# Patient Record
Sex: Female | Born: 1980 | Race: White | Hispanic: No | Marital: Single | Smoking: Current every day smoker
Health system: Southern US, Community
[De-identification: ages and names within clinical notes are randomized; demographics above are authoritative.]

## PROBLEM LIST (undated history)

## (undated) DIAGNOSIS — F419 Anxiety disorder, unspecified: Secondary | ICD-10-CM

## (undated) DIAGNOSIS — F319 Bipolar disorder, unspecified: Secondary | ICD-10-CM

## (undated) DIAGNOSIS — T7840XA Allergy, unspecified, initial encounter: Secondary | ICD-10-CM

## (undated) DIAGNOSIS — R569 Unspecified convulsions: Secondary | ICD-10-CM

## (undated) DIAGNOSIS — F840 Autistic disorder: Secondary | ICD-10-CM

## (undated) DIAGNOSIS — F909 Attention-deficit hyperactivity disorder, unspecified type: Secondary | ICD-10-CM

## (undated) DIAGNOSIS — F32A Depression, unspecified: Secondary | ICD-10-CM

## (undated) HISTORY — DX: Attention-deficit hyperactivity disorder, unspecified type: F90.9

## (undated) HISTORY — DX: Allergy, unspecified, initial encounter: T78.40XA

## (undated) HISTORY — DX: Depression, unspecified: F32.A

## (undated) HISTORY — PX: LEFT OOPHORECTOMY: SHX1961

## (undated) HISTORY — DX: Anxiety disorder, unspecified: F41.9

---

## 2003-11-10 ENCOUNTER — Emergency Department: Payer: Self-pay | Admitting: Emergency Medicine

## 2004-05-21 ENCOUNTER — Inpatient Hospital Stay: Payer: Self-pay | Admitting: Unknown Physician Specialty

## 2004-06-08 ENCOUNTER — Emergency Department: Payer: Self-pay | Admitting: Emergency Medicine

## 2004-07-17 ENCOUNTER — Emergency Department: Payer: Self-pay | Admitting: Emergency Medicine

## 2005-01-30 ENCOUNTER — Emergency Department: Payer: Self-pay | Admitting: Emergency Medicine

## 2006-06-17 ENCOUNTER — Inpatient Hospital Stay: Payer: Self-pay | Admitting: Psychiatry

## 2006-09-29 ENCOUNTER — Emergency Department: Payer: Self-pay | Admitting: Unknown Physician Specialty

## 2006-11-23 ENCOUNTER — Emergency Department: Payer: Self-pay | Admitting: Emergency Medicine

## 2006-12-30 ENCOUNTER — Emergency Department: Payer: Self-pay | Admitting: Emergency Medicine

## 2009-02-19 ENCOUNTER — Emergency Department: Payer: Self-pay | Admitting: Internal Medicine

## 2009-05-25 ENCOUNTER — Emergency Department: Payer: Self-pay | Admitting: Emergency Medicine

## 2009-08-09 ENCOUNTER — Emergency Department: Payer: Self-pay

## 2009-09-01 ENCOUNTER — Emergency Department: Payer: Self-pay | Admitting: Internal Medicine

## 2009-09-07 ENCOUNTER — Emergency Department: Payer: Self-pay | Admitting: Internal Medicine

## 2010-04-18 ENCOUNTER — Emergency Department: Payer: Self-pay | Admitting: Emergency Medicine

## 2010-08-01 ENCOUNTER — Emergency Department: Payer: Self-pay | Admitting: Emergency Medicine

## 2010-08-06 ENCOUNTER — Emergency Department: Payer: Self-pay | Admitting: Emergency Medicine

## 2010-08-12 ENCOUNTER — Emergency Department: Payer: Self-pay | Admitting: Emergency Medicine

## 2010-08-31 ENCOUNTER — Emergency Department: Payer: Self-pay | Admitting: *Deleted

## 2010-10-11 ENCOUNTER — Emergency Department (HOSPITAL_COMMUNITY)
Admission: EM | Admit: 2010-10-11 | Discharge: 2010-10-11 | Disposition: A | Discharge status: Home / Self Care | Payer: Medicaid Other | Attending: Emergency Medicine | Admitting: Emergency Medicine

## 2010-10-11 ENCOUNTER — Emergency Department (HOSPITAL_COMMUNITY): Payer: Medicaid Other

## 2010-10-11 DIAGNOSIS — IMO0002 Reserved for concepts with insufficient information to code with codable children: Secondary | ICD-10-CM | POA: Insufficient documentation

## 2010-10-11 DIAGNOSIS — N898 Other specified noninflammatory disorders of vagina: Secondary | ICD-10-CM | POA: Insufficient documentation

## 2010-10-11 DIAGNOSIS — N83209 Unspecified ovarian cyst, unspecified side: Secondary | ICD-10-CM | POA: Insufficient documentation

## 2010-10-11 DIAGNOSIS — R1031 Right lower quadrant pain: Secondary | ICD-10-CM | POA: Insufficient documentation

## 2010-10-11 DIAGNOSIS — G40909 Epilepsy, unspecified, not intractable, without status epilepticus: Secondary | ICD-10-CM | POA: Insufficient documentation

## 2010-10-11 DIAGNOSIS — R112 Nausea with vomiting, unspecified: Secondary | ICD-10-CM | POA: Insufficient documentation

## 2010-10-11 LAB — WET PREP, GENITAL
Clue Cells Wet Prep HPF POC: NONE SEEN
Trich, Wet Prep: NONE SEEN
Yeast Wet Prep HPF POC: NONE SEEN

## 2010-10-11 LAB — POCT PREGNANCY, URINE: Preg Test, Ur: NEGATIVE

## 2010-10-11 LAB — URINALYSIS, ROUTINE W REFLEX MICROSCOPIC
Ketones, ur: NEGATIVE mg/dL
Leukocytes, UA: NEGATIVE
Nitrite: NEGATIVE
Specific Gravity, Urine: 1.01 (ref 1.005–1.030)
pH: 5.5 (ref 5.0–8.0)

## 2010-12-06 ENCOUNTER — Emergency Department: Payer: Self-pay | Admitting: Emergency Medicine

## 2011-10-06 ENCOUNTER — Emergency Department: Payer: Self-pay | Admitting: Emergency Medicine

## 2011-10-06 LAB — BASIC METABOLIC PANEL
Calcium, Total: 9.9 mg/dL (ref 8.5–10.1)
Creatinine: 0.74 mg/dL (ref 0.60–1.30)
EGFR (Non-African Amer.): >60
Sodium: 138 mmol/L (ref 136–145)

## 2011-10-06 LAB — CBC WITH DIFFERENTIAL/PLATELET
Basophil %: 0.8 %
HCT: 38.5 % (ref 35.0–47.0)
HGB: 13.4 g/dL (ref 12.0–16.0)
Lymphocyte %: 25.3 %
Monocyte %: 8.3 %
Neutrophil #: 3.3 10*3/uL (ref 1.4–6.5)
WBC: 5.2 10*3/uL (ref 3.6–11.0)

## 2011-10-06 LAB — PHENYTOIN LEVEL, TOTAL: Dilantin: <0.4 ug/mL — ABNORMAL LOW (ref 10.0–20.0)

## 2011-11-19 ENCOUNTER — Emergency Department: Payer: Self-pay | Admitting: Emergency Medicine

## 2011-11-19 LAB — CBC WITH DIFFERENTIAL/PLATELET
Eosinophil %: 1.3 %
HGB: 13.8 g/dL (ref 12.0–16.0)
Lymphocyte #: 1.4 10*3/uL (ref 1.0–3.6)
MCH: 32.7 pg (ref 26.0–34.0)
Monocyte %: 7.3 %
Platelet: 202 10*3/uL (ref 150–440)
RBC: 4.22 10*6/uL (ref 3.80–5.20)

## 2011-11-19 LAB — PHENYTOIN LEVEL, TOTAL: Dilantin: <0.4 ug/mL — ABNORMAL LOW (ref 10.0–20.0)

## 2011-11-19 LAB — BASIC METABOLIC PANEL
Calcium, Total: 8.5 mg/dL (ref 8.5–10.1)
Chloride: 111 mmol/L — ABNORMAL HIGH (ref 98–107)
Osmolality: 282 (ref 275–301)
Potassium: 3.9 mmol/L (ref 3.5–5.1)

## 2012-03-01 ENCOUNTER — Emergency Department: Payer: Self-pay | Admitting: Emergency Medicine

## 2012-03-01 LAB — CBC WITH DIFFERENTIAL/PLATELET
Basophil #: 0 10*3/uL (ref 0.0–0.1)
Basophil %: 0.8 %
HCT: 41 % (ref 35.0–47.0)
MCH: 31.7 pg (ref 26.0–34.0)
MCHC: 34 g/dL (ref 32.0–36.0)
Monocyte #: 0.4 x10 3/mm (ref 0.2–0.9)
Monocyte %: 6.9 %
Neutrophil #: 3 10*3/uL (ref 1.4–6.5)

## 2012-03-01 LAB — URINALYSIS, COMPLETE
Bilirubin,UR: NEGATIVE
Blood: NEGATIVE
Nitrite: NEGATIVE
Specific Gravity: 1.023 (ref 1.003–1.030)

## 2012-03-01 LAB — BASIC METABOLIC PANEL
Anion Gap: 7 (ref 7–16)
BUN: 20 mg/dL — ABNORMAL HIGH (ref 7–18)
Co2: 27 mmol/L (ref 21–32)
Creatinine: 0.7 mg/dL (ref 0.60–1.30)
EGFR (Non-African Amer.): >60
Osmolality: 278 (ref 275–301)
Sodium: 138 mmol/L (ref 136–145)

## 2012-03-01 LAB — PHENYTOIN LEVEL, TOTAL: Dilantin: 0.6 ug/mL — ABNORMAL LOW (ref 10.0–20.0)

## 2012-03-09 ENCOUNTER — Inpatient Hospital Stay: Payer: Self-pay | Admitting: Internal Medicine

## 2012-03-09 LAB — CBC WITH DIFFERENTIAL/PLATELET
Basophil #: 0 10*3/uL (ref 0.0–0.1)
Basophil %: 0.5 %
Eosinophil #: 0.1 10*3/uL (ref 0.0–0.7)
Eosinophil %: 0.9 %
HCT: 42.1 % (ref 35.0–47.0)
HGB: 14.4 g/dL (ref 12.0–16.0)
Lymphocyte #: 1.6 10*3/uL (ref 1.0–3.6)
Lymphocyte %: 21.9 %
MCH: 31.9 pg (ref 26.0–34.0)
MCHC: 34.1 g/dL (ref 32.0–36.0)
MCV: 93 fL (ref 80–100)
Monocyte #: 0.3 x10 3/mm (ref 0.2–0.9)
Monocyte %: 4.8 %
Neutrophil #: 5.1 10*3/uL (ref 1.4–6.5)
Neutrophil %: 71.9 %
Platelet: 245 10*3/uL (ref 150–440)
RBC: 4.51 10*6/uL (ref 3.80–5.20)
RDW: 12.8 % (ref 11.5–14.5)
WBC: 7.1 10*3/uL (ref 3.6–11.0)

## 2012-03-09 LAB — HEPATIC FUNCTION PANEL A (ARMC)
Albumin: 4 g/dL (ref 3.4–5.0)
Alkaline Phosphatase: 98 U/L (ref 50–136)
Bilirubin, Direct: <0.05 mg/dL (ref 0.00–0.20)
Bilirubin,Total: 0.2 mg/dL (ref 0.2–1.0)
SGOT(AST): 25 U/L (ref 15–37)
SGPT (ALT): 52 U/L (ref 12–78)
Total Protein: 7.3 g/dL (ref 6.4–8.2)

## 2012-03-09 LAB — BASIC METABOLIC PANEL
Anion Gap: 8 (ref 7–16)
BUN: 14 mg/dL (ref 7–18)
Calcium, Total: 8.7 mg/dL (ref 8.5–10.1)
Chloride: 106 mmol/L (ref 98–107)
Co2: 25 mmol/L (ref 21–32)
Creatinine: 0.69 mg/dL (ref 0.60–1.30)
EGFR (African American): >60
EGFR (Non-African Amer.): >60
Glucose: 195 mg/dL — ABNORMAL HIGH (ref 65–99)
Osmolality: 283 (ref 275–301)
Potassium: 3.2 mmol/L — ABNORMAL LOW (ref 3.5–5.1)
Sodium: 139 mmol/L (ref 136–145)

## 2012-03-09 LAB — URINALYSIS, COMPLETE
Bilirubin,UR: NEGATIVE
Glucose,UR: NEGATIVE mg/dL (ref 0–75)
Leukocyte Esterase: NEGATIVE
Nitrite: NEGATIVE
Ph: 5 (ref 4.5–8.0)
Protein: NEGATIVE
RBC,UR: 20 /HPF (ref 0–5)
Specific Gravity: 1.028 (ref 1.003–1.030)
Squamous Epithelial: 7
WBC UR: 1 /HPF (ref 0–5)

## 2012-03-09 LAB — ETHANOL: Ethanol %: <0.003 % (ref 0.000–0.080)

## 2012-03-09 LAB — PHENYTOIN LEVEL, TOTAL
Dilantin: 44.7 ug/mL (ref 10.0–20.0)
Dilantin: 38.5 ug/mL (ref 10.0–20.0)

## 2012-03-10 LAB — HEMOGLOBIN A1C: Hemoglobin A1C: 4.6 %

## 2012-03-10 LAB — MAGNESIUM: Magnesium: 1.6 mg/dL — ABNORMAL LOW

## 2012-03-10 LAB — POTASSIUM: Potassium: 3.7 mmol/L (ref 3.5–5.1)

## 2012-03-11 LAB — PHENYTOIN LEVEL, TOTAL: Dilantin: 29 ug/mL — ABNORMAL HIGH (ref 10.0–20.0)

## 2013-01-02 ENCOUNTER — Emergency Department: Payer: Self-pay | Admitting: Emergency Medicine

## 2013-01-08 ENCOUNTER — Emergency Department: Payer: Self-pay | Admitting: Emergency Medicine

## 2013-01-25 ENCOUNTER — Emergency Department: Payer: Self-pay | Admitting: Emergency Medicine

## 2013-02-13 ENCOUNTER — Emergency Department: Payer: Self-pay | Admitting: Emergency Medicine

## 2013-02-28 ENCOUNTER — Emergency Department: Payer: Self-pay | Admitting: Emergency Medicine

## 2013-02-28 LAB — DRUG SCREEN, URINE
Amphetamines, Ur Screen: NEGATIVE (ref ?–1000)
BENZODIAZEPINE, UR SCRN: POSITIVE (ref ?–200)
Barbiturates, Ur Screen: NEGATIVE (ref ?–200)
CANNABINOID 50 NG, UR ~~LOC~~: POSITIVE (ref ?–50)
Cocaine Metabolite,Ur ~~LOC~~: POSITIVE (ref ?–300)
MDMA (Ecstasy)Ur Screen: NEGATIVE (ref ?–500)
METHADONE, UR SCREEN: NEGATIVE (ref ?–300)
Opiate, Ur Screen: NEGATIVE (ref ?–300)
PHENCYCLIDINE (PCP) UR S: NEGATIVE (ref ?–25)
TRICYCLIC, UR SCREEN: NEGATIVE (ref ?–1000)

## 2013-02-28 LAB — URINALYSIS, COMPLETE
Bacteria: NONE SEEN
Bilirubin,UR: NEGATIVE
Blood: NEGATIVE
GLUCOSE, UR: NEGATIVE mg/dL (ref 0–75)
KETONE: NEGATIVE
Leukocyte Esterase: NEGATIVE
NITRITE: NEGATIVE
Ph: 5 (ref 4.5–8.0)
Protein: NEGATIVE
RBC,UR: NONE SEEN /HPF (ref 0–5)
SPECIFIC GRAVITY: 1.024 (ref 1.003–1.030)
WBC UR: 3 /HPF (ref 0–5)

## 2014-06-01 NOTE — Discharge Summary (Signed)
PATIENT NAME:  Jennifer Woods, Jennifer Woods MR#:  [id number] DATE OF BIRTH:  August 30, 1980  DATE OF ADMISSION:  03/09/2012 DATE OF DISCHARGE:  03/11/2012  ADMITTING DIAGNOSIS: Dilantin toxicity.  DISCHARGE DIAGNOSES:  1. Dilantin toxicity with ataxia and dizziness as well as questionable syncope.  2. Mild orthostatic hypotension. 3. Nausea and vomiting, resolving, unclear etiology at this time, questionably related to Dilantin toxicity. 4. Tobacco and marijuana abuse. 5. Headaches. 6. Seizure disorder.   DISCHARGE CONDITION: Stable.   DISCHARGE MEDICATIONS: The patient is to resume her outpatient medications which are: 1. Aleve 220 mg every 8 hours as needed.  2. Dilantin 200 mg p.o. at bedtime.  3. Nicotine 21 mg patch transdermal daily.  4. Promethazine 25 mg p.o. every 6 hours as needed.  5. Meclizine 25 mg 3 times daily for dizziness.  HOME OXYGEN: None.   DIET:  Regular, regular consistency.   ACTIVITY LIMITATIONS: As tolerated.   FOLLOWUP APPOINTMENTS: With [facility] in 2 days after discharge. The patient needs to have her Dilantin level checked, on the 4th of February 2014, on Tuesday. She is to bring all the necessary paperwork on the 3rd of February 2014 to show medical staff at the [facility] to qualify her for their service.  CONSULTANTS: Care management.   RADIOLOGIC STUDIES: CT scan of the head without contrast, 29th January 2014, revealed no acute intracranial abnormalities, and findings which can be secondary to sinus disease, in left maxillary sinus.   HISTORY OF PRESENT ILLNESS: The patient is a 34 year old Caucasian female with history of seizure disorder who presented to the Emergency Room approximately 10 or so days ago prior to coming to the hospital on the 29th of January 2014. She had a seizure. At that time, her Dilantin level was less than 0.4. She was loaded with Dilantin and discharged on p.o. Dilantin. For the past 2 or 3 days, prior to coming to the  Emergency Room, she  developed ataxic gait, she lost balance, and she was also nauseated and had some vomiting. She decided to come to the Emergency Room whenever she was urged by her family and was admitted because of elevated Dilantin levels.   Her vital signs day of admission, temperature was 98.4, pulse was 79, respiration rate was 20, blood pressure 140/57 and saturation was 100% on room air. Physical examination was unremarkable.   The patient's lab data revealed glucose of 195 and potassium level was 3.2, otherwise BMP was unremarkable. The patient's hCG was less than 1. Liver enzymes were normal. Dilantin level was elevated at 44.7. CBC was within normal limits with white blood cell count of 7.1, hemoglobin 14.4, platelet count 245 and absolute neutrophil count was also normal at 5.1. Urinalysis revealed yellow cloudy urine, negative for glucose and bilirubin, trace ketones were noted as well as specific gravity 1.28, pH was 5.0, 2+ blood, negative for protein, nitrites or leukocyte esterase, 20 red blood cells and 1 white blood cell was seen, trace bacteria and 7 epithelial cells, as well as mucus was present. EKG showed normal sinus rhythm at 84 beats per minute, T-wave abnormality, consider inferior ischemia, according to EKG criteria, when compared to EKG done in July 2011, and T-wave inversions were now evident in inferior leads and nonspecific T wave abnormalities were evident in lateral leads.   HOSPITAL COURSE: The patient was admitted to the hospital for further evaluation. Her Dilantin was placed on hold. It was felt that the patient's ataxia and falls could have  been related to Dilantin toxicity. Her Dilantin, at the end of the day on the 29th of January 2014, was 38.5 down from 44.7, in the middle of day of admission. However, it went down to 29.0 on the 31st of January 2014. The patient felt satisfactory however still had some lightheadedness as well as dizziness and nausea. She was  advised to continue Phenergan as well as meclizine as needed. She was advised to start her medications at lower dose, at 200 mg p.o. daily dose, at bedtime. She is to follow up with the [facility] on Monday, the 3rd of February 2014 to initiate services and then follow up with them on the 4th of February 2014, on Tuesday, to check Dilantin level.  The patient's vital signs on the day of discharge: Temperature was 99.2, pulse was 85, respiratory rate was 18, blood pressure 108/ 57 and saturation was 99% on room air at rest.  Of note, the patient had some mild hypotension with systolic blood pressure around 96 to 100, on the 31st of January 2014. She was rehydrated and her blood pressure somewhat improved. She was able to eat her food prior to leaving the hospital. She ate 100% of her offered food with no residual symptoms.   TIME SPENT: 40 minutes.  ____________________________ Katharina Caperima Vernal Hritz, MD rv:sb D: 03/13/2012 19:33:49 ET T: 03/14/2012 11:01:21 ET JOB#: [id number]  cc: Katharina Caperima Chanze Teagle, MD, <Dictator> Cyndal Kasson MD ELECTRONICALLY SIGNED 03/31/2012 18:54

## 2014-06-01 NOTE — H&P (Signed)
PATIENT NAME:  Jennifer Woods, Jennifer Woods MR#:  [id number] DATE OF BIRTH:  1980-08-05  DATE OF ADMISSION:  03/09/2012  PRIMARY CARE PHYSICIAN: Does not have one.   CHIEF COMPLAINT: Dizziness, difficulty ambulating and a recent fall.   HISTORY OF PRESENT ILLNESS: This is a 34 year old female who presented to the Emergency Room due to difficulty ambulating, a recent fall and weakness. The patient has a history of epilepsy, is supposedly on Dilantin but ran out of her medications about 10 days or so ago. She presented to the ER on 03/01/2012. Her Dilantin level at that time was less than 0.4. She apparently presented with a seizure. She was loaded with Dilantin and discharged on some p.o. Dilantin. Over the past 2 or 3 days, she has had more of an ataxic gait to where she lost her balance she feels. She is nauseated. She is vomiting. She also mentions that she had a fall yesterday but does not recall it. Her mother urged her to come to the ER yesterday, but she did not decide to come until today. When she presented to the ER, she was noted to have a Dilantin level of 44. Hospitalist services were contacted for further treatment and evaluation.   REVIEW OF SYSTEMS:  CONSTITUTIONAL: No documented fever. No weight gain. No weight loss.  EYES: No blurry or double vision.  ENT: No tinnitus. No postnasal drip. No redness of the oropharynx.  RESPIRATORY: No cough, no wheeze, no hemoptysis, no dyspnea.  CARDIOVASCULAR: No chest pain, no orthopnea, no palpitations, no syncope.  GASTROINTESTINAL: Positive nausea. Positive vomiting. No diarrhea, no abdominal pain, no melena, no hematochezia.  GENITOURINARY: No dysuria, no hematuria.  ENDOCRINE: No polyuria or nocturia. No heat or cold intolerance.  HEMATOLOGIC: No anemia. No bruising. No bleeding.  INTEGUMENTARY: No rashes. No lesions.  MUSCULOSKELETAL: No arthritis, no swelling, no gout.  NEUROLOGIC: No numbness, no tingling. Positive ataxia. No seizure-type  activity.  PSYCH: No anxiety, no insomnia, no ADD.   PAST MEDICAL HISTORY: Just consistent with epilepsy.   ALLERGIES: KETOROLAC, VINEGAR.   SOCIAL HISTORY: Does smoke about 2 cigarettes per day, has been smoking for the past 15+ years. No alcohol abuse. Occasionally smokes marijuana.   FAMILY HISTORY: The patient's mother is alive, has a history of diverticulitis and Wolff-Parkinson-White. Father died from complications of a pulmonary embolism. He had prostate cancer.   CURRENT MEDICATIONS: Dilantin 300 mg at bedtime and Aleve as needed.   PHYSICAL EXAMINATION:  VITAL SIGNS: Temperature is 98.4, pulse 79, respirations 20, blood pressure 140/57, sats 100% on room air.  GENERAL: She is a pleasant appearing female in no apparent distress.  HEENT: Atraumatic, normocephalic. Extraocular muscles are intact. Pupils equal, round and reactive to light. Sclerae anicteric. No conjunctival injection. No pharyngeal erythema.  NECK: Supple. There is no jugular venous distention, no bruits, no lymphadenopathy or thyromegaly.  HEART: Regular rate and rhythm. No murmurs, no rubs, no clicks.  LUNGS: Clear to auscultation bilaterally. No rales, no rhonchi, no wheezes.  ABDOMEN: Soft, flat, nontender, nondistended. Has good bowel sounds. No hepatosplenomegaly appreciated.  EXTREMITIES: No evidence of any cyanosis, clubbing or peripheral edema. Has +2 pedal and radial pulses bilaterally.  NEUROLOGIC: The patient is alert, awake, oriented x 3. Negative finger-to-nose. No evidence of any diadochokinesis. I did not ambulate her, therefore, cannot comment on ataxia. No other focal motor or sensory deficits appreciated bilaterally.  SKIN: Moist and warm with no rashes.  LYMPHATIC: There is no cervical or axillary lymphadenopathy.  LABORATORY AND RADIOLOGICAL DATA: Serum glucose of 195, BUN 14, creatinine 0.6, sodium 139, potassium 3.2, chloride 106, bicarbonate 25. Dilantin level is 44.7. White cell count is 7.1,  hemoglobin 14.4, hematocrit 42.1, platelet count 245.   The patient did have a CT of the head done without contrast, which showed no evidence of acute intracranial abnormalities.   ASSESSMENT AND PLAN: This is a 34 year old female with a history of epilepsy who presented to the hospital with ataxic gait, nausea, vomiting, headache and feeling weak, noted to have a Dilantin level of 44.  1.  Dilantin toxicity. This is likely the cause of the patient's clinical symptoms of nausea, vomiting, headache and ataxic gait. Her CT head is negative. There is no other acute neurologic abnormality. The patient was apparently in the Emergency Room about a week or so ago and she had run out of her Dilantin, was loaded with fosphenytoin and discharged on her usual dose of 300 mg at bedtime, but now presents with toxic levels and above symptoms. I will go ahead and check an albumin level at this point. I also discussed the case with Poison Control. She has gotten 1 dose of charcoal now. We will redose the charcoal if needed if the patient is still symptomatic and her Dilantin level still remains high. I will repeat her Dilantin level at 10:00 tonight and then repeat it again tomorrow morning. Will follow her clinically.  2.  History of seizures. The patient is currently on Dilantin, but her level is toxic. Her dose probably needs to be adjusted prior to discharge.  3.  Hypokalemia. I will go ahead and replace her potassium accordingly and recheck in the morning. Will check a magnesium level too.   CODE STATUS: The patient is a full code.   TIME SPENT: 45 minutes.   ____________________________ Rolly PancakeVivek J. Cherlynn KaiserSainani, MD vjs:jm D: 03/09/2012 18:44:52 ET T: 03/09/2012 19:59:22 ET JOB#: [id number]  cc: Rolly PancakeVivek J. Cherlynn KaiserSainani, MD, <Dictator> Houston SirenVIVEK J Yamato Kopf MD ELECTRONICALLY SIGNED 03/12/2012 8:12

## 2016-06-05 ENCOUNTER — Emergency Department
Admission: EM | Admit: 2016-06-05 | Discharge: 2016-06-06 | Disposition: A | Discharge status: Other Acute Inpatient Hospital | Payer: Self-pay | Attending: Emergency Medicine | Admitting: Emergency Medicine

## 2016-06-05 ENCOUNTER — Emergency Department: Payer: Self-pay

## 2016-06-05 ENCOUNTER — Encounter: Payer: Self-pay | Admitting: Emergency Medicine

## 2016-06-05 DIAGNOSIS — F314 Bipolar disorder, current episode depressed, severe, without psychotic features: Secondary | ICD-10-CM | POA: Insufficient documentation

## 2016-06-05 DIAGNOSIS — R4182 Altered mental status, unspecified: Secondary | ICD-10-CM

## 2016-06-05 DIAGNOSIS — G40909 Epilepsy, unspecified, not intractable, without status epilepticus: Secondary | ICD-10-CM | POA: Insufficient documentation

## 2016-06-05 DIAGNOSIS — R45851 Suicidal ideations: Secondary | ICD-10-CM

## 2016-06-05 DIAGNOSIS — Z9119 Patient's noncompliance with other medical treatment and regimen: Secondary | ICD-10-CM

## 2016-06-05 DIAGNOSIS — Z91199 Patient's noncompliance with other medical treatment and regimen due to unspecified reason: Secondary | ICD-10-CM

## 2016-06-05 DIAGNOSIS — Z79899 Other long term (current) drug therapy: Secondary | ICD-10-CM | POA: Insufficient documentation

## 2016-06-05 DIAGNOSIS — F319 Bipolar disorder, unspecified: Secondary | ICD-10-CM

## 2016-06-05 DIAGNOSIS — F17200 Nicotine dependence, unspecified, uncomplicated: Secondary | ICD-10-CM | POA: Insufficient documentation

## 2016-06-05 DIAGNOSIS — R569 Unspecified convulsions: Secondary | ICD-10-CM

## 2016-06-05 DIAGNOSIS — F1410 Cocaine abuse, uncomplicated: Secondary | ICD-10-CM

## 2016-06-05 HISTORY — DX: Bipolar disorder, unspecified: F31.9

## 2016-06-05 HISTORY — DX: Unspecified convulsions: R56.9

## 2016-06-05 LAB — COMPREHENSIVE METABOLIC PANEL
ALBUMIN: 4.7 g/dL (ref 3.5–5.0)
ALT: 27 U/L (ref 14–54)
AST: 27 U/L (ref 15–41)
Alkaline Phosphatase: 51 U/L (ref 38–126)
Anion gap: 8 (ref 5–15)
BUN: 10 mg/dL (ref 6–20)
CHLORIDE: 106 mmol/L (ref 101–111)
CO2: 23 mmol/L (ref 22–32)
CREATININE: 0.8 mg/dL (ref 0.44–1.00)
Calcium: 9.2 mg/dL (ref 8.9–10.3)
GFR calc Af Amer: >60 mL/min (ref >60)
GLUCOSE: 87 mg/dL (ref 65–99)
POTASSIUM: 3.6 mmol/L (ref 3.5–5.1)
SODIUM: 137 mmol/L (ref 135–145)
Total Bilirubin: 0.7 mg/dL (ref 0.3–1.2)
Total Protein: 7.6 g/dL (ref 6.5–8.1)

## 2016-06-05 LAB — CBC
HCT: 40.1 % (ref 35.0–47.0)
Hemoglobin: 13.9 g/dL (ref 12.0–16.0)
MCH: 31.2 pg (ref 26.0–34.0)
MCHC: 34.6 g/dL (ref 32.0–36.0)
MCV: 90.3 fL (ref 80.0–100.0)
PLATELETS: 262 10*3/uL (ref 150–440)
RBC: 4.44 MIL/uL (ref 3.80–5.20)
RDW: 14.1 % (ref 11.5–14.5)
WBC: 7.7 10*3/uL (ref 3.6–11.0)

## 2016-06-05 LAB — URINE DRUG SCREEN, QUALITATIVE (ARMC ONLY)
AMPHETAMINES, UR SCREEN: NOT DETECTED
Barbiturates, Ur Screen: NOT DETECTED
Benzodiazepine, Ur Scrn: POSITIVE — AB
COCAINE METABOLITE, UR ~~LOC~~: POSITIVE — AB
Cannabinoid 50 Ng, Ur ~~LOC~~: POSITIVE — AB
MDMA (ECSTASY) UR SCREEN: NOT DETECTED
Methadone Scn, Ur: NOT DETECTED
Opiate, Ur Screen: NOT DETECTED
PHENCYCLIDINE (PCP) UR S: NOT DETECTED
Tricyclic, Ur Screen: NOT DETECTED

## 2016-06-05 LAB — SALICYLATE LEVEL: Salicylate Lvl: <7 mg/dL (ref 2.8–30.0)

## 2016-06-05 LAB — PHENYTOIN LEVEL, TOTAL

## 2016-06-05 LAB — ACETAMINOPHEN LEVEL: Acetaminophen (Tylenol), Serum: <10 ug/mL — ABNORMAL LOW (ref 10–30)

## 2016-06-05 LAB — PREGNANCY, URINE: PREG TEST UR: NEGATIVE

## 2016-06-05 LAB — ETHANOL

## 2016-06-05 MED ORDER — DIPHENHYDRAMINE HCL 25 MG PO CAPS
50.0000 mg | ORAL_CAPSULE | Freq: Once | ORAL | Status: AC
  Administered 2016-06-05: 50 mg via ORAL

## 2016-06-05 MED ORDER — PHENYTOIN 50 MG PO CHEW
300.0000 mg | CHEWABLE_TABLET | Freq: Once | ORAL | Status: AC
  Administered 2016-06-05: 300 mg via ORAL
  Filled 2016-06-05: qty 6

## 2016-06-05 MED ORDER — OLANZAPINE 10 MG PO TABS
10.0000 mg | ORAL_TABLET | Freq: Every day | ORAL | Status: DC
  Administered 2016-06-05: 10 mg via ORAL
  Filled 2016-06-05: qty 1

## 2016-06-05 MED ORDER — PHENYTOIN SODIUM EXTENDED 100 MG PO CAPS
300.0000 mg | ORAL_CAPSULE | Freq: Once | ORAL | Status: DC
  Administered 2016-06-05: 300 mg via ORAL
  Filled 2016-06-05: qty 3

## 2016-06-05 MED ORDER — LORAZEPAM 1 MG PO TABS
1.0000 mg | ORAL_TABLET | Freq: Once | ORAL | Status: AC
  Administered 2016-06-05: 1 mg via ORAL
  Filled 2016-06-05: qty 1

## 2016-06-05 MED ORDER — DIPHENHYDRAMINE HCL 25 MG PO CAPS
ORAL_CAPSULE | ORAL | Status: AC
  Filled 2016-06-05: qty 2

## 2016-06-05 NOTE — ED Notes (Signed)
Escorted pt. With officer to CT.

## 2016-06-05 NOTE — ED Notes (Signed)
[facility] 

## 2016-06-05 NOTE — ED Triage Notes (Signed)
States she is depressed and wants to hurt self  Hearing voices

## 2016-06-05 NOTE — ED Notes (Signed)
Report was received from Lenna Gilford., RN; Pt. Verbalizes  complaints of having Depression; with hearing voices;  Verbalizes having S.I. With a plan to jump off of a bridge; denies having Hi. Continue to monitor with 15 min. Monitoring.

## 2016-06-05 NOTE — ED Notes (Signed)

## 2016-06-05 NOTE — ED Provider Notes (Signed)
[facility] Emergency Department Provider Note  ____________________________________________  Time seen: Approximately 4:58 PM  I have reviewed the triage vital signs and the nursing notes.   HISTORY  Chief Complaint Suicidal    HPI Jennifer Woods is a 36 y.o. female who complains of feeling hopeless and loss of appetite and insomnia and depression. This is also associated with hearing voices. Denies any aggravating or alleviating factors. This is been going on for at least a week and seems to be worsening and increasingly distressing for her. She makes frequent respiratory to her son's death in the past.  She also reports that she has a history of seizures and intermittently takes her Dilantin due to lack of insurance or seeing a doctor. Her usual pattern is that she gets a prescription referral from the emergency department, takes it for a while and then it runs out. And she doesn't take it until she has a seizure, after which she goes to the emergency department and gets a new prescription. She reports not having it in several months. Dr. Maryellen Pile is her primary care doctor. Patient thinks that she had a seizure recently.       Past Medical History:  Diagnosis Date  . Bipolar 1 disorder (HCC)   . Seizures [facility])      Patient Active Problem List   Diagnosis Date Noted  . Bipolar 1 disorder (HCC) 06/05/2016  . Seizures (HCC) 06/05/2016  . Cocaine abuse 06/05/2016  . Noncompliance 06/05/2016     History reviewed. No pertinent surgical history.   Prior to Admission medications   Not on File     Allergies Patient has no known allergies.   No family history on file.  Social History Social History  Substance Use Topics  . Smoking status: Current Every Day Smoker  . Smokeless tobacco: Never Used  . Alcohol use No    Review of Systems  Constitutional:   No fever or chills.  ENT:   No sore throat. No rhinorrhea. Lymphatic: No swollen  glands, No extremity swelling Endocrine: No hot/cold flashes. No significant weight change. No neck swelling. Cardiovascular:   No chest pain or syncope. Respiratory:   No dyspnea or cough. Gastrointestinal:   Negative for abdominal pain, vomiting and diarrhea.  Genitourinary:   Negative for dysuria or difficulty urinating. Musculoskeletal:   Negative for focal pain or swelling Neurological:   Negative for headaches or weakness.Positive for recent seizure a few days ago. All other systems reviewed and are negative except as documented above in ROS and HPI.  ____________________________________________   PHYSICAL EXAM:  VITAL SIGNS: ED Triage Vitals  Enc Vitals Group     BP 06/05/16 1544 (!) 126/99     Pulse Rate 06/05/16 1544 64     Resp 06/05/16 1544 20     Temp 06/05/16 1544 98.5 F (36.9 C)     Temp src --      SpO2 06/05/16 1544 99 %     Weight 06/05/16 1544 165 lb (74.8 kg)     Height 06/05/16 1544 5' (1.524 m)     Head Circumference --      Peak Flow --      Pain Score 06/05/16 1640 6     Pain Loc --      Pain Edu? --      Excl. in GC? --     Vital signs reviewed, nursing assessments reviewed.   Constitutional:   Alert and oriented. Well appearing and  in no distress. Eyes:   No scleral icterus. No conjunctival pallor. PERRL. EOMI.  No nystagmus. ENT   Head:   Normocephalic and atraumatic.   Nose:   No congestion/rhinnorhea. No septal hematoma   Mouth/Throat:   MMM, no pharyngeal erythema. No peritonsillar mass.    Neck:   No stridor. No SubQ emphysema. No meningismus. Hematological/Lymphatic/Immunilogical:   No cervical lymphadenopathy. Cardiovascular:   RRR. Symmetric bilateral radial and DP pulses.  No murmurs.  Respiratory:   Normal respiratory effort without tachypnea nor retractions. Breath sounds are clear and equal bilaterally. No wheezes/rales/rhonchi. Gastrointestinal:   Soft and nontender. Non distended. There is no CVA tenderness.  No  rebound, rigidity, or guarding. Genitourinary:   deferred Musculoskeletal:   Normal range of motion in all extremities. No joint effusions.  No lower extremity tenderness.  No edema. Neurologic:   Normal speech and language.  CN 2-10 normal. Motor grossly intact. No gross focal neurologic deficits are appreciated.  Skin:    Skin is warm, dry and intact. No rash noted.  No petechiae, purpura, or bullae.  ____________________________________________    LABS (pertinent positives/negatives) (all labs ordered are listed, but only abnormal results are displayed) Labs Reviewed  ACETAMINOPHEN LEVEL - Abnormal; Notable for the following:       Result Value   Acetaminophen (Tylenol), Serum <10 (*)    All other components within normal limits  COMPREHENSIVE METABOLIC PANEL  ETHANOL  SALICYLATE LEVEL  CBC  URINE DRUG SCREEN, QUALITATIVE ([facility] ONLY)  PHENYTOIN LEVEL, TOTAL  POC URINE PREG, ED   ____________________________________________   EKG    ____________________________________________    RADIOLOGY  No results found.  ____________________________________________   PROCEDURES Procedures  ____________________________________________   INITIAL IMPRESSION / ASSESSMENT AND PLAN / ED COURSE  Pertinent labs & imaging results that were available during my care of the patient were reviewed by me and considered in my medical decision making (see chart for details).  Patient's well-appearing no acute distress, presents with multiple symptoms of depression with psychotic features.      Clinical Course as of Jun 05 1701  Fri Jun 05, 2016  1630 Discussed with Dr. Toni Amend after our evaluation here in the ED. He will place this patient under involuntary commitment to admit for her depression with psychotic features. I will obtain a phenytoin level and give her a phenytoin load for control of her chronic epilepsy which seems to be worsened by report in the setting of medication  noncompliance. She otherwise appears to be medically stable to proceed with psychiatric treatment.  [PS]    Clinical Course User Index [PS] Sharman Cheek, MD     ____________________________________________   FINAL CLINICAL IMPRESSION(S) / ED DIAGNOSES  Final diagnoses:  Suicidal thoughts  Bipolar I disorder with depression, severe (HCC)  Nonintractable epilepsy without status epilepticus, unspecified epilepsy type [facility])      New Prescriptions   No medications on file     Portions of this note were generated with dragon dictation software. Dictation errors may occur despite best attempts at proofreading.    Sharman Cheek, MD 06/05/16 [phone]

## 2016-06-05 NOTE — ED Notes (Signed)
Pt dressed out into appropriate behavioral health clothing. Pt belongings consist of a pair of black tennis shoes, a pair of blue jeans, a grey jacket, 2 black hair ties, a grey t shirt, a black bra, a black cell phone, a white necklace with a white cross, a yellow ring with a purple stone and a white ring with a blue stone. pt had a pair of grey socks on but threw them away in triage.

## 2016-06-05 NOTE — ED Notes (Signed)
BEHAVIORAL HEALTH ROUNDING Patient sleeping: No. Patient alert and oriented: yes Behavior appropriate: Yes.  ; If no, describe:  Nutrition and fluids offered: yes Toileting and hygiene offered: Yes  Sitter present: q15 minute observations and security  monitoring Law enforcement present: Yes  ODS  

## 2016-06-05 NOTE — Consult Note (Signed)
[facility] Consult   Reason for Consult:  Consult for 36 year old woman who presented voluntarily for severe mood symptoms and suicidal thoughts. Referring Physician:  Joni Fears Patient Identification: CHAVY AVERA MRN:  [id number] Principal Diagnosis: Bipolar 1 disorder [facility]) Diagnosis:   Patient Active Problem List   Diagnosis Date Noted  . Bipolar 1 disorder ([place]) [F31.9] 06/05/2016  . Seizures ([place]) [R56.9] 06/05/2016  . Cocaine abuse [F14.10] 06/05/2016  . Noncompliance [Z91.19] 06/05/2016    Total Time spent with patient: 1 hour  Subjective:   Jennifer Woods is a 36 y.o. female patient admitted with "it's all come back".  HPI:  Patient interviewed chart reviewed. Labs reviewed. Case discussed with TTS and emergency room physician. 36 year old woman presented herself voluntarily to the emergency room. She reports that for the past couple weeks possibly more her mood symptoms of been getting worse. She feels jittery and anxious all the time. She feels like her mind is racing and she can't concentrate. She feels sick and overwhelmed. Her sleep patterns are erratic. She is not eating well. She has been having suicidal thoughts and today was standing on an overpass thinking of jumping off. She says she is having auditory hallucinations. She will sometimes hear her name being called and will feel like people are saying things to her that don't make any sense. Patient is not taking any psychiatric medicine. She is supposed to be on Dilantin for her diagnosis of epilepsy and has not been compliant possibly for weeks. She reports that 2 days ago she relapsed on to powder cocaine although she emphasizes that her psychiatric symptoms were all present prior to the relapse.  Social history: Patient lives with her boyfriend and his child. She has a teenage son of her own who does not live with her. Patient does a little bit of side work but doesn't have insurance and doesn't  have her regular job.  Medical history: Epilepsy diagnosed in childhood. Patient says she has both grand mal and petit mal seizures. She is supposed to be taking Dilantin but has been noncompliant due to finances and just not taking responsibility for it. Domi when she last had a grand mal seizure but she suspects that she's been having petit mal seizures.  Substance abuse history: History of abuse of cocaine intermittently. She says it's been about 3 years since she used. She also says that she uses marijuana 3-4 times a week supposedly in an effort to control her seizures.  Past Psychiatric History: Patient has had psychiatric hospitalization in the past. She says she had a hospitalization here about 8 years ago. Has also been to the state hospital in [place]. Patient does have a prior history of suicide attempts. Today she was having serious thoughts of jumping off a bridge. She reports previous medications have included several antidepressants most of which just made her hallucinate and feel more agitated. She remembers being prescribed Seroquel and said it made her feel like a zombie. No history of violence. Prior diagnosis of bipolar disorder.  Risk to Self: Is patient at risk for suicide?: Yes Risk to Others:   Prior Inpatient Therapy:   Prior Outpatient Therapy:    Past Medical History:  Past Medical History:  Diagnosis Date  . Bipolar 1 disorder ([place])   . Seizures ([place])    History reviewed. No pertinent surgical history. Family History: No family history on file. Family Psychiatric  History: She says several people in her family of had mental illness she  had a grandmother who was severely mentally ill and hospitalized multiple times. Social History:  History  Alcohol Use No     History  Drug Use  . Types: Marijuana    Social History   Social History  . Marital status: Single    Spouse name: N/A  . Number of children: N/A  . Years of education: N/A   Social History Main  Topics  . Smoking status: Current Every Day Smoker  . Smokeless tobacco: Never Used  . Alcohol use No  . Drug use: Yes    Types: Marijuana  . Sexual activity: Not Asked   Other Topics Concern  . None   Social History Narrative  . None   Additional Social History:    Allergies:  No Known Allergies  Labs:  Results for orders placed or performed during the hospital encounter of 06/05/16 (from the past 48 hour(s))  Comprehensive metabolic panel     Status: None   Collection Time: 06/05/16  3:43 PM  Result Value Ref Range   Sodium 137 135 - 145 mmol/L   Potassium 3.6 3.5 - 5.1 mmol/L   Chloride 106 101 - 111 mmol/L   CO2 23 22 - 32 mmol/L   Glucose, Bld 87 65 - 99 mg/dL   BUN 10 6 - 20 mg/dL   Creatinine, Ser 0.80 0.44 - 1.00 mg/dL   Calcium 9.2 8.9 - 10.3 mg/dL   Total Protein 7.6 6.5 - 8.1 g/dL   Albumin 4.7 3.5 - 5.0 g/dL   AST 27 15 - 41 U/L   ALT 27 14 - 54 U/L   Alkaline Phosphatase 51 38 - 126 U/L   Total Bilirubin 0.7 0.3 - 1.2 mg/dL   GFR calc non Af Amer >60 >60 mL/min   GFR calc Af Amer >60 >60 mL/min    Comment: (NOTE) The eGFR has been calculated using the CKD EPI equation. This calculation has not been validated in all clinical situations. eGFR's persistently <60 mL/min signify possible Chronic Kidney Disease.    Anion gap 8 5 - 15  Ethanol     Status: None   Collection Time: 06/05/16  3:43 PM  Result Value Ref Range   Alcohol, Ethyl (B) <5 <5 mg/dL    Comment:        LOWEST DETECTABLE LIMIT FOR SERUM ALCOHOL IS 5 mg/dL FOR MEDICAL PURPOSES ONLY   Salicylate level     Status: None   Collection Time: 06/05/16  3:43 PM  Result Value Ref Range   Salicylate Lvl <1.7 2.8 - 30.0 mg/dL  Acetaminophen level     Status: Abnormal   Collection Time: 06/05/16  3:43 PM  Result Value Ref Range   Acetaminophen (Tylenol), Serum <10 (L) 10 - 30 ug/mL    Comment:        THERAPEUTIC CONCENTRATIONS VARY SIGNIFICANTLY. A RANGE OF 10-30 ug/mL MAY BE AN  EFFECTIVE CONCENTRATION FOR MANY PATIENTS. HOWEVER, SOME ARE BEST TREATED AT CONCENTRATIONS OUTSIDE THIS RANGE. ACETAMINOPHEN CONCENTRATIONS >150 ug/mL AT 4 HOURS AFTER INGESTION AND >50 ug/mL AT 12 HOURS AFTER INGESTION ARE OFTEN ASSOCIATED WITH TOXIC REACTIONS.   cbc     Status: None   Collection Time: 06/05/16  3:43 PM  Result Value Ref Range   WBC 7.7 3.6 - 11.0 K/uL   RBC 4.44 3.80 - 5.20 MIL/uL   Hemoglobin 13.9 12.0 - 16.0 g/dL   HCT 40.1 35.0 - 47.0 %   MCV 90.3 80.0 - 100.0 fL  MCH 31.2 26.0 - 34.0 pg   MCHC 34.6 32.0 - 36.0 g/dL   RDW 14.1 11.5 - 14.5 %   Platelets 262 150 - 440 K/uL    Current Facility-Administered Medications  Medication Dose Route Frequency Provider Last Rate Last Dose  . diphenhydrAMINE (BENADRYL) 25 mg capsule           . LORazepam (ATIVAN) tablet 1 mg  1 mg Oral Once Gonzella Lex, MD      . OLANZapine (ZYPREXA) tablet 10 mg  10 mg Oral QHS Gonzella Lex, MD      . phenytoin (DILANTIN) chewable tablet 300 mg  300 mg Oral Once Gonzella Lex, MD       No current outpatient prescriptions on file.    Musculoskeletal: Strength & Muscle Tone: within normal limits Gait & Station: normal Patient leans: N/A  Psychiatric Specialty Exam: Physical Exam  Nursing note and vitals reviewed. Constitutional: She appears well-developed and well-nourished.  HENT:  Head: Normocephalic and atraumatic.  Eyes: Conjunctivae are normal. Pupils are equal, round, and reactive to light.  Neck: Normal range of motion.  Cardiovascular: Regular rhythm and normal heart sounds.   Respiratory: Effort normal. No respiratory distress.  GI: Soft.  Musculoskeletal: Normal range of motion.  Neurological: She is alert.  Skin: Skin is warm and dry.  Psychiatric: Her speech is delayed. She is slowed and withdrawn. Cognition and memory are impaired. She expresses impulsivity. She exhibits a depressed mood. She expresses suicidal ideation. She expresses suicidal plans.     Review of Systems  Constitutional: Negative.   HENT: Negative.   Eyes: Negative.   Respiratory: Negative.   Cardiovascular: Negative.   Gastrointestinal: Negative.   Musculoskeletal: Negative.   Skin: Negative.   Neurological: Negative.   Psychiatric/Behavioral: Positive for depression, hallucinations, memory loss, substance abuse and suicidal ideas. The patient is nervous/anxious and has insomnia.     Blood pressure (!) 126/99, pulse 64, temperature 98.5 F (36.9 C), resp. rate 20, height 5' (1.524 m), weight 74.8 kg (165 lb), last menstrual period 05/14/2016, SpO2 99 %.Body mass index is 32.22 kg/m.  General Appearance: Casual  Eye Contact:  Fair  Speech:  Slow  Volume:  Decreased  Mood:  Depressed  Affect:  Depressed and Tearful  Thought Process:  Disorganized  Orientation:  Full (Time, Place, and Person)  Thought Content:  Rumination and Tangential  Suicidal Thoughts:  Yes.  with intent/plan  Homicidal Thoughts:  No  Memory:  Immediate;   Good Recent;   Poor Remote;   Fair  Judgement:  Fair  Insight:  Fair  Psychomotor Activity:  Decreased  Concentration:  Concentration: Fair  Recall:  [employer] of Knowledge:  Fair  Language:  Fair  Akathisia:  No  Handed:  Right  AIMS (if indicated):     Assets:  Desire for Improvement Housing Resilience Social Support  ADL's:  Intact  Cognition:  Impaired,  Mild  Sleep:        Treatment Plan Summary: Daily contact with patient to assess and evaluate symptoms and progress in treatment, Medication management and Plan 36 year old woman who has what sounds like bipolar disorder that has recently gotten worse with return of both depressive and some manic-like psychotic-like symptoms. Active suicidal ideation. Also abusing cocaine although the patient says that all of her suicidality was present even before the relapse. Patient is tearful and withdrawn. She has enough insight to of coming here voluntarily. Commitment papers of  been filed  to make sure that we can get appropriate treatment. Case reviewed with emergency room doctor. Labs are still coming in. I went ahead and ordered a Dilantin level and oral Dilantin. Further treatment of her seizures will be managed by the emergency room before admission. I have gone ahead and ordered Zyprexa 10 mg at night as an initial treatment for her agitated mood symptoms. Ordered a head CT just to be on the safe side given her history of epilepsy. Patient will need psychiatric admission from everything I can see right now. I will go ahead and put in admission orders. Situation reviewed with TTS.  Disposition: Recommend psychiatric Inpatient admission when medically cleared. Supportive therapy provided about ongoing stressors.  Alethia Berthold, MD 06/05/2016 4:54 PM

## 2016-06-05 NOTE — ED Notes (Signed)

## 2016-06-06 ENCOUNTER — Inpatient Hospital Stay
Admission: AD | Admit: 2016-06-06 | Discharge: 2016-06-09 | DRG: 885 | Disposition: A | Discharge status: Home / Self Care | Payer: No Typology Code available for payment source | Source: Ambulatory Visit | Attending: Psychiatry | Admitting: Psychiatry

## 2016-06-06 DIAGNOSIS — F1310 Sedative, hypnotic or anxiolytic abuse, uncomplicated: Secondary | ICD-10-CM | POA: Diagnosis present

## 2016-06-06 DIAGNOSIS — Z79899 Other long term (current) drug therapy: Secondary | ICD-10-CM | POA: Diagnosis not present

## 2016-06-06 DIAGNOSIS — R569 Unspecified convulsions: Secondary | ICD-10-CM

## 2016-06-06 DIAGNOSIS — R45851 Suicidal ideations: Secondary | ICD-10-CM | POA: Diagnosis present

## 2016-06-06 DIAGNOSIS — Z62810 Personal history of physical and sexual abuse in childhood: Secondary | ICD-10-CM | POA: Diagnosis present

## 2016-06-06 DIAGNOSIS — F1410 Cocaine abuse, uncomplicated: Secondary | ICD-10-CM | POA: Diagnosis present

## 2016-06-06 DIAGNOSIS — R4182 Altered mental status, unspecified: Secondary | ICD-10-CM

## 2016-06-06 DIAGNOSIS — Z9119 Patient's noncompliance with other medical treatment and regimen: Secondary | ICD-10-CM

## 2016-06-06 DIAGNOSIS — Z915 Personal history of self-harm: Secondary | ICD-10-CM

## 2016-06-06 DIAGNOSIS — G40409 Other generalized epilepsy and epileptic syndromes, not intractable, without status epilepticus: Secondary | ICD-10-CM | POA: Diagnosis present

## 2016-06-06 DIAGNOSIS — F3163 Bipolar disorder, current episode mixed, severe, without psychotic features: Secondary | ICD-10-CM | POA: Diagnosis not present

## 2016-06-06 DIAGNOSIS — R440 Auditory hallucinations: Secondary | ICD-10-CM | POA: Diagnosis present

## 2016-06-06 DIAGNOSIS — F1220 Cannabis dependence, uncomplicated: Secondary | ICD-10-CM | POA: Diagnosis present

## 2016-06-06 DIAGNOSIS — Z818 Family history of other mental and behavioral disorders: Secondary | ICD-10-CM

## 2016-06-06 DIAGNOSIS — F4310 Post-traumatic stress disorder, unspecified: Secondary | ICD-10-CM | POA: Diagnosis present

## 2016-06-06 DIAGNOSIS — G4700 Insomnia, unspecified: Secondary | ICD-10-CM | POA: Diagnosis present

## 2016-06-06 DIAGNOSIS — Z91199 Patient's noncompliance with other medical treatment and regimen due to unspecified reason: Secondary | ICD-10-CM

## 2016-06-06 DIAGNOSIS — F17200 Nicotine dependence, unspecified, uncomplicated: Secondary | ICD-10-CM

## 2016-06-06 DIAGNOSIS — F1110 Opioid abuse, uncomplicated: Secondary | ICD-10-CM | POA: Diagnosis present

## 2016-06-06 DIAGNOSIS — F1320 Sedative, hypnotic or anxiolytic dependence, uncomplicated: Secondary | ICD-10-CM

## 2016-06-06 MED ORDER — HYDROXYZINE HCL 50 MG PO TABS
50.0000 mg | ORAL_TABLET | Freq: Three times a day (TID) | ORAL | Status: DC | PRN
  Administered 2016-06-06 (×2): 50 mg via ORAL
  Filled 2016-06-06 (×2): qty 1

## 2016-06-06 MED ORDER — NICOTINE 14 MG/24HR TD PT24
14.0000 mg | MEDICATED_PATCH | Freq: Every day | TRANSDERMAL | Status: DC
  Administered 2016-06-06 – 2016-06-09 (×4): 14 mg via TRANSDERMAL
  Filled 2016-06-06 (×5): qty 1

## 2016-06-06 MED ORDER — PHENYTOIN SODIUM EXTENDED 100 MG PO CAPS: 300.0000 mg | ORAL_CAPSULE | Freq: Once | ORAL | Status: DC

## 2016-06-06 MED ORDER — IBUPROFEN 600 MG PO TABS
600.0000 mg | ORAL_TABLET | Freq: Four times a day (QID) | ORAL | Status: DC | PRN
  Administered 2016-06-06 – 2016-06-09 (×5): 600 mg via ORAL
  Filled 2016-06-06 (×5): qty 1

## 2016-06-06 MED ORDER — ALUM & MAG HYDROXIDE-SIMETH 200-200-20 MG/5ML PO SUSP: 30.0000 mL | ORAL | Status: DC | PRN

## 2016-06-06 MED ORDER — LORAZEPAM 1 MG PO TABS
1.0000 mg | ORAL_TABLET | Freq: Once | ORAL | Status: AC
  Administered 2016-06-06: 1 mg via ORAL
  Filled 2016-06-06: qty 1

## 2016-06-06 MED ORDER — DIPHENHYDRAMINE HCL 25 MG PO CAPS
25.0000 mg | ORAL_CAPSULE | Freq: Once | ORAL | Status: AC
  Administered 2016-06-06: 25 mg via ORAL
  Filled 2016-06-06: qty 1

## 2016-06-06 MED ORDER — TEMAZEPAM 15 MG PO CAPS
15.0000 mg | ORAL_CAPSULE | Freq: Every evening | ORAL | Status: DC | PRN
  Administered 2016-06-06: 15 mg via ORAL
  Filled 2016-06-06: qty 1

## 2016-06-06 MED ORDER — PHENYTOIN SODIUM EXTENDED 100 MG PO CAPS
ORAL_CAPSULE | ORAL | Status: AC
  Administered 2016-06-06: 300 mg
  Filled 2016-06-06: qty 3

## 2016-06-06 MED ORDER — PHENYTOIN 50 MG PO CHEW
300.0000 mg | CHEWABLE_TABLET | Freq: Every day | ORAL | Status: DC
  Administered 2016-06-06: 300 mg via ORAL
  Filled 2016-06-06: qty 6

## 2016-06-06 MED ORDER — ACETAMINOPHEN 325 MG PO TABS
650.0000 mg | ORAL_TABLET | Freq: Four times a day (QID) | ORAL | Status: DC | PRN
  Administered 2016-06-07: 650 mg via ORAL
  Filled 2016-06-06: qty 2

## 2016-06-06 MED ORDER — OLANZAPINE 10 MG PO TABS
10.0000 mg | ORAL_TABLET | Freq: Every day | ORAL | Status: DC
  Administered 2016-06-06: 10 mg via ORAL
  Filled 2016-06-06: qty 1

## 2016-06-06 MED ORDER — MAGNESIUM HYDROXIDE 400 MG/5ML PO SUSP
30.0000 mL | Freq: Every day | ORAL | Status: DC | PRN
  Administered 2016-06-07 – 2016-06-09 (×2): 30 mL via ORAL
  Filled 2016-06-06 (×2): qty 30

## 2016-06-06 MED ORDER — ACETAMINOPHEN 325 MG PO TABS
650.0000 mg | ORAL_TABLET | Freq: Once | ORAL | Status: AC
  Administered 2016-06-06: 650 mg via ORAL
  Filled 2016-06-06: qty 2

## 2016-06-06 NOTE — ED Notes (Signed)
Pt given lunch tray.

## 2016-06-06 NOTE — [facility] Suicide Risk Assessment (Signed)
[facility] Admission Suicide Risk Assessment   Nursing information obtained from:    Demographic factors:    Current Mental Status:    Loss Factors:    Historical Factors:    Risk Reduction Factors:      Principal Problem: Bipolar 1 disorder, mixed, severe (H[facility]) Diagnosis:   Patient Active Problem List   Diagnosis Date Noted  . Bipolar 1 disorder, mixed, severe (H[facility]) [F31.63] 06/06/2016  . Tobacco use disorder [F17.200] 06/06/2016  . Seizures ([facility]) [R56.9] 06/05/2016  . Cocaine abuse [F14.10] 06/05/2016  . Noncompliance [Z91.19] 06/05/2016   Subjective Data:   Continued Clinical Symptoms:  Alcohol Use Disorder Identification Test Final Score (AUDIT): 1 The "Alcohol Use Disorders Identification Test", Guidelines for Use in Primary Care, Second Edition.  World Science writer [facility]). Score between 0-7:  no or low risk or alcohol related problems. Score between 8-15:  moderate risk of alcohol related problems. Score between 16-19:  high risk of alcohol related problems. Score 20 or above:  warrants further diagnostic evaluation for alcohol dependence and treatment.   CLINICAL FACTORS:   Bipolar Disorder:   Depressive phase Alcohol/Substance Abuse/Dependencies More than one psychiatric diagnosis Previous Psychiatric Diagnoses and Treatments   Musculoskeletal:  Psychiatric Specialty Exam: Physical Exam  ROS  Blood pressure 120/68, pulse 79, temperature 98 F (36.7 C), temperature source Oral, resp. rate 18, height 5' (1.524 m), weight 78 kg (172 lb), last menstrual period 05/14/2016, SpO2 100 %.Body mass index is 33.59 kg/m.                                                    Sleep:         COGNITIVE FEATURES THAT CONTRIBUTE TO RISK:  Polarized thinking    SUICIDE RISK:   Moderate:  Frequent suicidal ideation with limited intensity, and duration, some specificity in terms of plans, no associated intent, good self-control, limited  dysphoria/symptomatology, some risk factors present, and identifiable protective factors, including available and accessible social support.  PLAN OF CARE: admit to [facility]  I certify that inpatient services furnished can reasonably be expected to improve the patient's condition.   Jimmy Footman, MD 06/06/2016, 5:09 PM

## 2016-06-06 NOTE — ED Notes (Signed)
Pt discharged to BMU. Pt is IVC. All belongings will be sent with pt. Pt accepting.

## 2016-06-06 NOTE — Progress Notes (Signed)
D: Patient complaints of chest tighting , stated it was like the panic attack  A: Patient received vistaril R: Will continue to monitor.

## 2016-06-06 NOTE — Tx Team (Signed)
Initial Treatment Plan 06/06/2016 4:04 PM Jennifer Woods [id number]    PATIENT STRESSORS: Financial difficulties Health problems Substance abuse   PATIENT STRENGTHS: Ability for insight Active sense of humor Average or above average intelligence Capable of independent living Supportive family/friends   PATIENT IDENTIFIED PROBLEMS: Suicide 06/06/16  Depression 06/06/16  Substance abuse 06/06/16                 DISCHARGE CRITERIA:  Ability to meet basic life and health needs Improved stabilization in mood, thinking, and/or behavior Motivation to continue treatment in a less acute level of care  PRELIMINARY DISCHARGE PLAN: Outpatient therapy Return to previous living arrangement Return to previous work or school arrangements  PATIENT/FAMILY INVOLVEMENT: This treatment plan has been presented to and reviewed with the patient, [name], and/or family member, .  The patient and family have been given the opportunity to ask questions and make suggestions.  Crist Infante, RN 06/06/2016, 4:04 PM

## 2016-06-06 NOTE — ED Notes (Signed)
Patient asleep in room. No noted distress or abnormal behavior. Will continue 15 minute checks and observation by security cameras for safety. 

## 2016-06-06 NOTE — ED Notes (Addendum)
Pt complaining of itching and anxiety. ER MD notified. Medication administered as prescribed. Maintained on 15 minute checks and observation by security camera for safety.

## 2016-06-06 NOTE — BH Assessment (Signed)
Patient is to be admitted to [facility] by Dr. Toni Amend.  Attending Physician will be Dr. Jennet Maduro.   Patient has been assigned to room 305, by [facility] Charge Nurse Arthur F.   Intake Paper Work has been signed and placed on patient chart.  ER staff is aware of the admission (Dr. Alphonzo Lemmings, ER MD; Vic Ripper.,  Patient's Nurse & Clint Lipps Patient Access).

## 2016-06-06 NOTE — Progress Notes (Signed)
Pt denies SI/HI/AVH. In room upon approach cursing to self. Pt given Gatoraide and offered encouragement and support. Pt given Vistaril for anxiety and ibuprofen for back pain. Pt ate evening snack and was heard and observed signing songs in dayroom with peer. Ambulating without issue and appears to be in no distress. Affect is irritable and Pt presents many time to RN station with various requests. Pt is no longer c/o of chest pain. Voices no additional concerns at this time. Safety maintained. Will continue to monitor.

## 2016-06-06 NOTE — ED Notes (Signed)
Pt speaking to LCSW. Pt remains calm and cooperative. Maintained on 15 minute checks and observation by security camera for safety.

## 2016-06-06 NOTE — ED Notes (Signed)
Pt calm and cooperative. Pt given breakfast tray. Pt endorses SI. "I'm so tired of my life."  Pt stated she walked away from her house and didn't stop until she arrived "where the ambulances are."    Pt taking a shower.   Pt complaining of back pain. Medication ordered by ER MD.   Maintained on 15 minute checks and observation by security camera for safety.

## 2016-06-06 NOTE — ED Notes (Signed)
Report given to Gwen, RN 

## 2016-06-06 NOTE — Plan of Care (Signed)
Problem: Education: Goal: Knowledge of [facility] Education information/materials will improve Outcome: Progressing Verbalize understanding  fof information  Received

## 2016-06-06 NOTE — ED Provider Notes (Signed)
-----------------------------------------   7:20 AM on 06/06/2016 -----------------------------------------   Blood pressure 102/76, pulse 69, temperature 98.1 F (36.7 C), temperature source Oral, resp. rate 18, height 5' (1.524 m), weight 165 lb (74.8 kg), last menstrual period 05/14/2016, SpO2 100 %.  The patient had no acute events since last update.  Calm and cooperative at this time.  Disposition is pending Psychiatry/Behavioral Medicine team recommendations.     Jeanmarie Plant, MD 06/06/16 [phone]

## 2016-06-06 NOTE — Progress Notes (Signed)
ED LCSW met with patient and collected data to complete BMU assessment. Will enter data once transferred down to BMU

## 2016-06-06 NOTE — ED Provider Notes (Addendum)
-----------------------------------------   12:49 PM on 06/06/2016 -----------------------------------------  Patient with hives, she states that since she was a child she gets hives when she is anxious and she states she's having a panic attack. No evidence of anaphylaxis lungs are clear, breathing with no distress, she states she feels very upset and anxious. She states she wants something for her nerves. I did give her Benadryl and her hives are nearly gone. I don't think this is an allergic reaction to anything else that is possible to have stress-induced hives. And according to the patient this is been a pattern for her her whole life. She denies any chest pain to me, she states she is just very anxious and upset. We will give her Ativan and I think that will help calm her nerves. She is otherwise in no acute distress speaking with clear voice, no stridor no tongue swelling, and no evidence of anaphylaxis but we will continue to observe her.  ----------------------------------------- 2:24 PM on 06/06/2016 -----------------------------------------  After Ativan, all of her symptoms have resolved and she is calm and feeling better. She is eager to go downstairs and be treated. Still no evidence of anaphylaxis and at this time hives have completely resolved  Jeanmarie Plant, MD 06/06/16 1250    Jeanmarie Plant, MD 06/06/16 1425

## 2016-06-06 NOTE — Progress Notes (Addendum)
D: 36  year old white female in under the  service of  Dr. Jennet Maduro  Patient  Admitted for suicidal  And depression , substance  Abuse . Stated she has been managing her seizures  With  Smoking marijuana. Non compliant  With her dilantin  Patient voice of her mood being up and down , feels jittery and anxious all the time . Voice of having a panic attack  In the ER. Patient had been standing over an overpass thinking to jump off.  Lives with boyfriend  And his child . Relapsed on cocaine  A: Searched  With no contraband found Skin assessment  Fell on knee left  skin abrassion  Scab covering . Instructed on unit  Programing /safety and meals . Patient also received a unit  Booklet  For all other information.R: Voice no other concerns

## 2016-06-06 NOTE — Progress Notes (Signed)
Pt continues to c/o of chest pain, although she is walking around unit and seen in dayroom socializing with peers. Pt eating evening snacks. Dr on call was notified of Pt complaints and order for ibuprofen received for pain. Pt VSS.  No distress noted.

## 2016-06-06 NOTE — [facility] Group Notes (Signed)
[facility] Group Notes:  (Nursing/MHT/Case Management/Adjunct)  Date:  06/06/2016  Time:  10:33 PM  Type of Therapy:  Psychoeducational Skills  Participation Level:  Active  Participation Quality:  Attentive  Affect:  Appropriate  Cognitive:  Appropriate  Insight:  Lacking  Engagement in Group:  Improving  Modes of Intervention:  Discussion and Exploration  Summary of Progress/Problems:  Jennifer Woods 06/06/2016, 10:33 PM
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
## 2016-06-06 NOTE — ED Notes (Addendum)
ER MD came to unit to see patient. Medication for anxiety ordered and administered. Pt resting in bed comfortably. Maintained on 15 minute checks and observation by security camera for safety.

## 2016-06-07 DIAGNOSIS — F1220 Cannabis dependence, uncomplicated: Secondary | ICD-10-CM

## 2016-06-07 DIAGNOSIS — F1320 Sedative, hypnotic or anxiolytic dependence, uncomplicated: Secondary | ICD-10-CM

## 2016-06-07 DIAGNOSIS — F3163 Bipolar disorder, current episode mixed, severe, without psychotic features: Principal | ICD-10-CM

## 2016-06-07 LAB — TSH: TSH: 2.021 u[IU]/mL (ref 0.350–4.500)

## 2016-06-07 LAB — LIPID PANEL
Cholesterol: 121 mg/dL (ref 0–200)
HDL: 45 mg/dL (ref >40)
LDL Cholesterol: 61 mg/dL (ref 0–99)
Total CHOL/HDL Ratio: 2.7 RATIO
Triglycerides: 73 mg/dL (ref <150)
VLDL: 15 mg/dL (ref 0–40)

## 2016-06-07 MED ORDER — BENZTROPINE MESYLATE 1 MG PO TABS
0.5000 mg | ORAL_TABLET | Freq: Two times a day (BID) | ORAL | Status: DC
  Administered 2016-06-07 – 2016-06-09 (×5): 0.5 mg via ORAL
  Filled 2016-06-07 (×5): qty 1

## 2016-06-07 MED ORDER — LORAZEPAM 2 MG PO TABS
2.0000 mg | ORAL_TABLET | Freq: Every day | ORAL | Status: DC
  Administered 2016-06-07 – 2016-06-08 (×2): 2 mg via ORAL
  Filled 2016-06-07 (×2): qty 1

## 2016-06-07 MED ORDER — ZIPRASIDONE HCL 40 MG PO CAPS
40.0000 mg | ORAL_CAPSULE | Freq: Two times a day (BID) | ORAL | Status: DC
  Administered 2016-06-07 – 2016-06-08 (×2): 40 mg via ORAL
  Filled 2016-06-07 (×2): qty 1

## 2016-06-07 MED ORDER — PHENYTOIN SODIUM EXTENDED 100 MG PO CAPS
300.0000 mg | ORAL_CAPSULE | Freq: Every day | ORAL | Status: DC
  Administered 2016-06-07 – 2016-06-08 (×2): 300 mg via ORAL
  Filled 2016-06-07 (×2): qty 3

## 2016-06-07 MED ORDER — HYDROXYZINE HCL 25 MG PO TABS
25.0000 mg | ORAL_TABLET | Freq: Three times a day (TID) | ORAL | Status: DC | PRN
  Administered 2016-06-07 – 2016-06-08 (×2): 25 mg via ORAL
  Filled 2016-06-07 (×2): qty 1
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
## 2016-06-07 NOTE — Plan of Care (Signed)
Problem: Self-Concept: Goal: Ability to disclose and discuss suicidal ideas will improve Outcome: Progressing Patient states that she is no longer having suicidal thoughts. Contracts for safety.

## 2016-06-07 NOTE — Progress Notes (Signed)
Patient noted to be anxious and needy. Kept asking for one thing after another. She said that she is not as depressed and suicidal as she was when she came in. She said that she does not feel like jumping over a bridge anymore. She however said that she is still hearing some voices of someone calling her name down the hallways. She complained of chest pain and Cogentin was administered as ordered. Patient later stated that she no longer had chest pain and said that the medication(Cogentin) helped her a lot. Patient says that she feels more"normal" today. She even said that she worked on her poems and journals today. Positive praise given to patient for good self-reported progress.

## 2016-06-07 NOTE — Progress Notes (Signed)
Pt denies SI/HI/AVH. Noted to have improved mood. Appropriate during interaction. Medication compliant. Attended evening group. Voices no additional concerns at this time. Encouragement and support provided. Safety maintained. Will continue to monitor.

## 2016-06-07 NOTE — [facility] Group Notes (Signed)
[facility] LCSW Group Therapy  06/07/2016 2:44 PM  Type of Therapy:  Group Therapy  Participation Level:  Active  Participation Quality:  Appropriate  Affect:  Appropriate  Cognitive:  Alert  Insight:  Developing/Improving  Engagement in Therapy:  Developing/Improving  Modes of Intervention:  Discussion, Education, Problem-solving, Reality Testing, Socialization and Support  Summary of Progress/Problems: Coping Skills: Patients defined and discussed healthy coping skills. Patients identified healthy coping skills they would like to try during hospitalization and after discharge. CSW offered insight to varying coping skills that may have been new to patients such as practicing mindfulness.  Markus Casten G. Garnette Czech MSW, LCSWA 06/07/2016, 2:46 PM

## 2016-06-07 NOTE — Plan of Care (Signed)
Problem: Self-Concept: Goal: Level of anxiety will decrease Outcome: Progressing Pt noted to have improved mood and is not complaining of anxiety as frequently as previously noted.

## 2016-06-08 MED ORDER — ZIPRASIDONE HCL 40 MG PO CAPS
60.0000 mg | ORAL_CAPSULE | Freq: Two times a day (BID) | ORAL | Status: DC
  Administered 2016-06-08 – 2016-06-09 (×2): 60 mg via ORAL
  Filled 2016-06-08 (×2): qty 1

## 2016-06-08 NOTE — Progress Notes (Signed)
Recreation Therapy Notes  Date: 04.30.18 Time: 1:00 pm Location: Craft Room  Group Topic: Wellness  Goal Area(s) Addresses:  Patient will identify at least one item per dimension of health. Patient will examine areas they are deficient in.  Behavioral Response: Did not attend  Intervention: 6 Dimensions of Health  Activity: Patients were given a definition sheet defining the dimensions of health and a worksheet with each dimension listed. Patients were instructed to write at least one item they were currently doing in each dimension.  Education: LRT educated patients on ways to improve each dimension.  Education Outcome: Patient did not attend group.  Clinical Observations/Feedback: Patient did not attend group.  Casmer Yepiz,Juanita M, LRT/CTRS 06/08/2016 1:49 PM 

## 2016-06-08 NOTE — [facility] Suicide Risk Assessment (Signed)
[facility] INPATIENT:  Family/Significant Other Suicide Prevention Education  Suicide Prevention Education:  Contact Attempts: Rito Ehrlich, boyfriend, [phone], has been identified by the patient as the family member/significant other with whom the patient will be residing, and identified as the person(s) who will aid the patient in the event of a mental health crisis.  With written consent from the patient, two attempts were made to provide suicide prevention education, prior to and/or following the patient's discharge.  We were unsuccessful in providing suicide prevention education.  A suicide education pamphlet was given to the patient to share with family/significant other.  Date and time of first attempt:06/08/16, 1455 Date and time of second attempt:  Lorri Frederick, LCSW 06/08/2016, 2:56 PM
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
## 2016-06-08 NOTE — [facility] Suicide Risk Assessment (Signed)
[facility] INPATIENT:  Family/Significant Other Suicide Prevention Education  Suicide Prevention Education:  Education Completed; Rito Ehrlich, boyfriend,[phone], has been identified by the patient as the family member/significant other with whom the patient will be residing, and identified as the person(s) who will aid the patient in the event of a mental health crisis (suicidal ideations/suicide attempt).  With written consent from the patient, the family member/significant other has been provided the following suicide prevention education, prior to the and/or following the discharge of the patient.  The suicide prevention education provided includes the following:  Suicide risk factors  Suicide prevention and interventions  National Suicide Hotline telephone number  [facility] assessment telephone number  [facility] Emergency Assistance 911  [facility] and/or Residential Mobile Crisis Unit telephone number  Request made of family/significant other to:  Remove weapons (e.g., guns, rifles, knives), all items previously/currently identified as safety concern.  No guns in home, per Sam.  Remove drugs/medications (over-the-counter, prescriptions, illicit drugs), all items previously/currently identified as a safety concern.  The family member/significant other verbalizes understanding of the suicide prevention education information provided.  The family member/significant other agrees to remove the items of safety concern listed above.  Lorri Frederick, LCSW 06/08/2016, 4:10 PM

## 2016-06-08 NOTE — [facility] Group Notes (Signed)
[facility] Group Notes:  (Nursing/MHT/Case Management/Adjunct)  Date:  06/08/2016  Time:  12:12 AM  Type of Therapy:  Group Therapy  Participation Level:  Active  Participation Quality:  Appropriate  Affect:  Appropriate  Cognitive:  Alert  Insight:  Good  Engagement in Group:  Engaged  Modes of Intervention:  Support  Summary of Progress/Problems:  Mayra Neer 06/08/2016, 12:12 AM

## 2016-06-08 NOTE — Progress Notes (Signed)
Patient ID: Jennifer Woods, female   DOB: 10-27-1980, 36 y.o.   MRN: [id number] Observed in room resting, spontaneous response on prompt, A&Ox3, "can I get my medications earlier so I can go to bed?.." c/o lower back pain 4/10, verbalized partial relief of pain and trending down after Ibuprofen 600 mg previously given @ 1624, lucid, no thoughts disordered, denied SI/HI/AVH at this moment; bedtime medications given @ 2101.

## 2016-06-08 NOTE — Plan of Care (Signed)
Problem: Medication: Goal: Compliance with prescribed medication regimen will improve Outcome: Progressing Patient medication compliant   

## 2016-06-08 NOTE — [facility] Group Notes (Signed)
[facility] LCSW Group Therapy  06/08/2016 1:24 PM  Type of Therapy:  Group Therapy  Participation Level:  Active  Participation Quality:  Appropriate  Affect:  Appropriate  Cognitive:  Alert  Insight:  Developing/Improving  Engagement in Therapy:  Engaged  Modes of Intervention:  Activity, Discussion, Education, Problem-solving, Dance movement psychotherapist, Socialization and Support  Summary of Progress/Problems: Stress management: Patients defined and discussed the topic of stress and the related symptoms and triggers for stress. Patients identified healthy coping skills they would like to try during hospitalization and after discharge to manage stress in a healthy way. CSW offered insight to varying stress management techniques.   Zaeem Kandel G. Garnette Czech MSW, LCSWA 06/08/2016, 1:26 PM

## 2016-06-08 NOTE — Tx Team (Signed)
Interdisciplinary Treatment and Diagnostic Plan Update  06/08/2016 Time of Session: 1110 Jennifer Woods MRN: [id number]  Principal Diagnosis: Bipolar 1 disorder, mixed, severe (HCC)  Secondary Diagnoses: Principal Problem:   Bipolar 1 disorder, mixed, severe (HCC) Active Problems:   Seizures (HCC)   Cocaine abuse   Noncompliance   Tobacco use disorder   Sedative, hypnotic or anxiolytic use disorder, severe, dependence (HCC)   Cannabis use disorder, moderate, dependence (HCC)   Current Medications:  Current Facility-Administered Medications  Medication Dose Route Frequency Provider Last Rate Last Dose  . acetaminophen (TYLENOL) tablet 650 mg  650 mg Oral Q6H PRN John T Clapacs, MD   650 mg at 06/07/16 0827  . alum & mag hydroxide-simeth (MAALOX/MYLANTA) 200-200-20 MG/5ML suspension 30 mL  30 mL Oral Q4H PRN John T Clapacs, MD      . benztropine (COGENTIN) tablet 0.5 mg  0.5 mg Oral BID Andrea Hernandez-Gonzalez, MD   0.5 mg at 06/08/16 0807  . hydrOXYzine (ATARAX/VISTARIL) tablet 25 mg  25 mg Oral TID PRN Andrea Hernandez-Gonzalez, MD   25 mg at 06/08/16 1447  . ibuprofen (ADVIL,MOTRIN) tablet 600 mg  600 mg Oral Q6H PRN Andrea Hernandez-Gonzalez, MD   600 mg at 06/08/16 0810  . LORazepam (ATIVAN) tablet 2 mg  2 mg Oral QHS Andrea Hernandez-Gonzalez, MD   2 mg at 06/07/16 2123  . magnesium hydroxide (MILK OF MAGNESIA) suspension 30 mL  30 mL Oral Daily PRN John T Clapacs, MD   30 mL at 06/07/16 1442  . nicotine (NICODERM CQ - dosed in mg/24 hours) patch 14 mg  14 mg Transdermal Daily Andrea Hernandez-Gonzalez, MD   14 mg at 06/08/16 0807  . phenytoin (DILANTIN) ER capsule 300 mg  300 mg Oral QHS Andrea Hernandez-Gonzalez, MD   300 mg at 06/07/16 2123  . ziprasidone (GEODON) capsule 60 mg  60 mg Oral BID WC Andrea Hernandez-Gonzalez, MD       PTA Medications: Prescriptions Prior to Admission  Medication Sig Dispense Refill Last Dose  . phenytoin (DILANTIN) 100 MG ER capsule Take  300 mg by mouth at bedtime.   More than a month at Unknown time    Patient Stressors: Financial difficulties Health problems Substance abuse  Patient Strengths: Ability for insight Active sense of humor Average or above average intelligence Capable of independent living Supportive family/friends  Treatment Modalities: Medication Management, Group therapy, Case management,  1 to 1 session with clinician, Psychoeducation, Recreational therapy.   Physician Treatment Plan for Primary Diagnosis: Bipolar 1 disorder, mixed, severe (HCC) Long Term Goal(s): Improvement in symptoms so as ready for discharge Improvement in symptoms so as ready for discharge   Short Term Goals: Ability to identify changes in lifestyle to reduce recurrence of condition will improve Ability to identify and develop effective coping behaviors will improve Ability to identify triggers associated with substance abuse/mental health issues will improve Ability to verbalize feelings will improve Ability to disclose and discuss suicidal ideas  Medication Management: Evaluate patient's response, side effects, and tolerance of medication regimen.  Therapeutic Interventions: 1 to 1 sessions, Unit Group sessions and Medication administration.  Evaluation of Outcomes: Not Met  Physician Treatment Plan for Secondary Diagnosis: Principal Problem:   Bipolar 1 disorder, mixed, severe (HCC) Active Problems:   Seizures (HCC)   Cocaine abuse   Noncompliance   Tobacco use disorder   Sedative, hypnotic or anxiolytic use disorder, severe, dependence (HCC)   Cannabis use disorder, moderate, dependence (HCC)  Long Term Goal(s): Improvement   in symptoms so as ready for discharge Improvement in symptoms so as ready for discharge   Short Term Goals: Ability to identify changes in lifestyle to reduce recurrence of condition will improve Ability to identify and develop effective coping behaviors will improve Ability to identify  triggers associated with substance abuse/mental health issues will improve Ability to verbalize feelings will improve Ability to disclose and discuss suicidal ideas     Medication Management: Evaluate patient's response, side effects, and tolerance of medication regimen.  Therapeutic Interventions: 1 to 1 sessions, Unit Group sessions and Medication administration.  Evaluation of Outcomes: Not Met   RN Treatment Plan for Primary Diagnosis: Bipolar 1 disorder, mixed, severe (HCC) Long Term Goal(s): Knowledge of disease and therapeutic regimen to maintain health will improve  Short Term Goals: Ability to identify and develop effective coping behaviors will improve  Medication Management: RN will administer medications as ordered by provider, will assess and evaluate patient's response and provide education to patient for prescribed medication. RN will report any adverse and/or side effects to prescribing provider.  Therapeutic Interventions: 1 on 1 counseling sessions, Psychoeducation, Medication administration, Evaluate responses to treatment, Monitor vital signs and CBGs as ordered, Perform/monitor CIWA, COWS, AIMS and Fall Risk screenings as ordered, Perform wound care treatments as ordered.  Evaluation of Outcomes: Not Met   LCSW Treatment Plan for Primary Diagnosis: Bipolar 1 disorder, mixed, severe (HCC) Long Term Goal(s): Safe transition to appropriate next level of care at discharge, Engage patient in therapeutic group addressing interpersonal concerns.  Short Term Goals: Engage patient in aftercare planning with referrals and resources and Increase social support  Therapeutic Interventions: Assess for all discharge needs, 1 to 1 time with Social worker, Explore available resources and support systems, Assess for adequacy in community support network, Educate family and significant other(s) on suicide prevention, Complete Psychosocial Assessment, Interpersonal group  therapy.  Evaluation of Outcomes: Not Met    Recreational Therapy Treatment Plan for Primary Diagnosis: Bipolar 1 disorder, mixed, severe (HCC) Long Term Goal(s): Patient will participate in recreation therapy treatment in at least 2 group sessions without prompting from LRT  Short Term Goals: Increase stress management skills, Increase healthy coping skills  Treatment Modalities: Group Therapy and Individual Treatment Sessions  Therapeutic Interventions: Psychoeducation  Evaluation of Outcomes: Progressing   Progress in Treatment: Attending groups: Yes. Participating in groups: Yes. Taking medication as prescribed: Yes. Toleration medication: Yes. Family/Significant other contact made: No, will contact:  boyfriend Patient understands diagnosis: Yes. Discussing patient identified problems/goals with staff: Yes. Medical problems stabilized or resolved: Yes. Denies suicidal/homicidal ideation: Yes. Issues/concerns per patient self-inventory: No. Other: none  New problem(s) identified: No, Describe:  none  New Short Term/Long Term Goal(s): PT goal is "to get my meds regularted and to manage my symptoms."  Discharge Plan or Barriers: Pt will be referred to outpatient services.  Reason for Continuation of Hospitalization: Depression Medication stabilization  Estimated Length of Stay: 1-2 days. Attendees: Patient: Jennifer Woods 06/08/2016 2:57 PM  Physician: Dr. Hernandez, MD 06/08/2016 2:57 PM  Nursing: Phyllis Cobb, RN 06/08/2016 2:57 PM  RN Care Manager: 06/08/2016 2:57 PM  Social Worker: Greg Wierda, LCSW 06/08/2016 2:57 PM  Recreational Therapist: Quanna , LRT/CTRS  06/08/2016 2:57 PM  Other:  06/08/2016 2:57 PM  Other:  06/08/2016 2:57 PM  Other: 06/08/2016 2:57 PM    Scribe for Treatment Team: Wierda, Gregory Jon, LCSW 06/08/2016 2:57 PM 

## 2016-06-08 NOTE — Progress Notes (Signed)
Pt reports "sleeping so good, I could cry" Pt reports she has not slept this well in a long time. Denies SI, HI, AVH. Medication and group compliant. Appropriate with staff and peers. Complaints of anxiety, and back pain. PRNs given with good relief.  Encouragement and support offered. Safety checks maintained. Pt receptive and remains safe on unit with q 15 min checks.

## 2016-06-08 NOTE — Progress Notes (Signed)
Recreation Therapy Notes  INPATIENT RECREATION THERAPY ASSESSMENT  Patient Details Name: Georgenia M TucJAKERIA [id number] DOB: [birth year] Today's Date: 06/08/2016  Patient Stressors: Family, Friends, Other (Comment) (Mom was incarcerated last month, older brother busy, younger brother uses; lack of supportive friends; trouble sleeping, not hungry, chest pains due to anxiety about leaving, forgetting things)  Coping Skills:   Isolate, Substance Abuse, Avoidance, Exercise, Art/Dance, Talking, Music, Sports, Other (Comment) (Read and write poetry, video games, binge watch TV)  Personal Challenges: Communication, Concentration, Relationships, Stress Management, Substance Abuse, Trusting Others  Leisure Interests (2+):  Individual - Other (Comment) (Crafts, games, reading, writing, videos on YouTube)  Awareness of Community Resources:  Yes  Community Resources:  [employer], [place]  Current Use: Yes  If no, Barriers?:    Patient Strengths:  Sense of humor, fearlessness  Patient Identified Areas of Improvement:  Keep staying sober, take medications, get mind right  Current Recreation Participation:  [place] at step son's school, park  Patient Goal for Hospitalization:  To get medications corrects to start functioning again  [place] of Residence:  [place] of Residence:  [place]   Current SI (including self-harm):  No  Current HI:  No  Consent to Intern Participation: N/A   Janeli, [place], LRT/CTRS 06/08/2016, 3:38 PM

## 2016-06-09 LAB — HEMOGLOBIN A1C
Hgb A1c MFr Bld: 5 % (ref 4.8–5.6)
MEAN PLASMA GLUCOSE: 97 mg/dL

## 2016-06-09 MED ORDER — BENZTROPINE MESYLATE 0.5 MG PO TABS: 0.5000 mg | ORAL_TABLET | Freq: Two times a day (BID) | ORAL | 0 refills | Status: DC

## 2016-06-09 MED ORDER — ZIPRASIDONE HCL 60 MG PO CAPS: 60.0000 mg | ORAL_CAPSULE | Freq: Two times a day (BID) | ORAL | 0 refills | Status: DC

## 2016-06-09 NOTE — [facility] Group Notes (Signed)
[facility] Group Notes:  (Nursing/MHT/Case Management/Adjunct)  Date:  06/09/2016  Time:  2:04 AM  Type of Therapy:  Group Therapy  Participation Level:  Active  Participation Quality:  Appropriate  Affect:  Appropriate  Cognitive:  Appropriate  Insight:  Good and Improving  Engagement in Group:  Engaged  Modes of Intervention:  N/A  Summary of Progress/Problems:  Jennifer Woods 06/09/2016, 2:04 AM

## 2016-06-09 NOTE — Plan of Care (Signed)
Problem: [facility] Participation in Recreation Therapeutic Interventions Goal: STG-Patient will identify at least five coping skills for ** STG: Coping Skills - Within 3 treatment sessions, patient will verbalize at least 5 coping skills for substance abuse in one treatment session to decrease substance abuse.  Outcome: Completed/Met Date Met: 06/09/16 Treatment Session 1; Completed 1 out of 1: At approximately 8:40 am, LRT met with patient in patient room. Patient verbalized 5 coping skills for substance abuse. LRT educated patient on leisure and why it is important for her to implement it into her schedule. LRT educated and provided patient with blank schedules to help her plan her day and try to avoid using substances. LRT educated patient on healthy support systems.  Leonette Monarch, LRT/CTRS 05.01.18 11:50 am Goal: STG-Other Recreation Therapy Goal (Specify) STG: Stress Management - Within 3 treatment sessions, patient will verbalize understanding of the stress management techniques in one treatment session to increase stress management skills.  Outcome: Completed/Met Date Met: 06/09/16 Treatment Session 1; Completed 1 out of 1: At approximately 8:40 am, LRT met with patient in patient room. LRT educated and provided patient with handouts on stress management techniques. Patient verbalized understanding. LRT encouraged patient to read over and practice the stress management techniques.  Leonette Monarch, LRT/CTRS 05.01.18 11:51 am

## 2016-06-09 NOTE — Plan of Care (Signed)
Problem: Activity: Goal: Sleeping patterns will improve Outcome: Progressing Patient slept for Estimated Hours of 7.15; every 15 minutes safety round maintained, no injury or falls during this shift.    

## 2016-06-09 NOTE — [facility] Group Notes (Signed)
[facility] LCSW Group Therapy Note  Date/Time: 06/09/2016, 9:30 AM  Type of Therapy/Topic:  Group Therapy:  Feelings about Diagnosis  Participation Level:  Active   Mood: Appropriate    Description of Group:    This group will allow patients to explore their thoughts and feelings about diagnoses they have received. Patients will be guided to explore their level of understanding and acceptance of these diagnoses. Facilitator will encourage patients to process their thoughts and feelings about the reactions of others to their diagnosis, and will guide patients in identifying ways to discuss their diagnosis with significant others in their lives. This group will be process-oriented, with patients participating in exploration of their own experiences as well as giving and receiving support and challenge from other group members.   Therapeutic Goals: 1. Patient will demonstrate understanding of diagnosis as evidence by identifying two or more symptoms of the disorder:  2. Patient will be able to express two feelings regarding the diagnosis 3. Patient will demonstrate ability to communicate their needs through discussion and/or role plays  Summary of Patient Progress: Patient shared her diagnosis with group and stated that she feels hopeful in regards to her treatment. Patient identified coping mechanisms to assist her with recovery at discharge. CSW provided support to patient.     Therapeutic Modalities:   Cognitive Behavioral Therapy Brief Therapy Feelings Identification   Hampton Abbot, MSW, LCSW 06/09/2016, 11:31AM
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
## 2016-06-09 NOTE — Progress Notes (Signed)
Patient discharged home. Discharge instructions provided and explained. Medications reviewed, Rx given. All questions answered, denies SI, HI, AVH. Belongings returned, AVS, transition and risk summary given to patient. Pt stable at discharge.

## 2016-06-09 NOTE — Progress Notes (Addendum)
  [facility] Adult Case Management Discharge Plan :  Will you be returning to the same living situation after discharge:  Yes,  with boyfriend At discharge, do you have transportation home?: Yes,  boyfriend Do you have the ability to pay for your medications: No. Medication management clinic referral made.  Release of information consent forms completed and in the chart;  Patient's signature needed at discharge.  Patient to Follow up at: Follow-up Information    [facility]. Go on 06/10/2016.   Why:  Please meet Unk Pinto at [facility] for your follow up appointment on 06/10/16 at 7:15am.  Please bring a copy of your hospital discharge paperwork. Contact information: [address] [place] [address] [phone]           Next level of care provider has access to [facility] Link:no  Safety Planning and Suicide Prevention discussed: Yes,  with boyfriend  Have you used any form of tobacco in the last 30 days? (Cigarettes, Smokeless Tobacco, Cigars, and/or Pipes): Yes  Has patient been referred to the Quitline?: Patient refused referral  Patient has been referred for addiction treatment: Yes  Lorri Frederick, LCSW 06/09/2016, 10:39 AM

## 2016-06-09 NOTE — Progress Notes (Signed)
Recreation Therapy Notes  INPATIENT RECREATION TR PLAN  Patient Details Name: Jennifer Woods MRN: [id number] DOB: [birth year] Today's Date: 06/09/2016  Rec Therapy Plan Is patient appropriate for Therapeutic Recreation?: Yes Treatment times per week: At least once a week TR Treatment/Interventions: 1:1 session, Group participation (Comment) (Appropriate participation in daily recreational therapy tx)  Discharge Criteria Pt will be discharged from therapy if:: Treatment goals are met, Discharged Treatment plan/goals/alternatives discussed and agreed upon by:: Patient/family  Discharge Summary Short term goals set: See Care Plan Short term goals met: Complete Progress toward goals comments: One-to-one attended One-to-one attended: Stress management, coping skills Reason goals not met: N/A Therapeutic equipment acquired: None Reason patient discharged from therapy: Discharge from hospital Pt/family agrees with progress & goals achieved: Yes Date patient discharged from therapy: 06/09/16   Leonette Monarch, LRT/CTRS 06/09/2016, 1:16 PM

## 2016-07-01 ENCOUNTER — Ambulatory Visit: Payer: Self-pay | Admitting: Pharmacy Technician

## 2016-07-01 NOTE — Progress Notes (Signed)
[facility] scheduled [facility]  Patient called an hour before appoint and stated that she did not have transportation to her appointment on Jul 01, 2016 at 2:00p.m.  [place]  Unable to walk due to seizures.  Appointment changed to July 16, 2016 at 10:30a.m.  Arranged for [facility] to pick-up patient.  Made patient aware that if she cannot attend appointment on June 7th to call in advance, because we have to notify [facility] .  [facility] unable to provide additional medication assistance until eligibility is determined.  Sherilyn DacostaBetty J. Shamonica Schadt Care Manager [facility]

## 2016-07-16 ENCOUNTER — Encounter (INDEPENDENT_AMBULATORY_CARE_PROVIDER_SITE_OTHER): Payer: Self-pay

## 2016-07-16 ENCOUNTER — Ambulatory Visit: Payer: No Typology Code available for payment source | Admitting: Pharmacy Technician

## 2016-07-16 DIAGNOSIS — Z79899 Other long term (current) drug therapy: Secondary | ICD-10-CM

## 2016-07-16 NOTE — Progress Notes (Signed)
Completed [facility] application and contract.  Patient agreed to all terms of the [facility] contract.   Patient approved to receive medication assistance at [facility] through 2018, as long as eligibility continues to be met.   Provided patient with community resource material based on her particular needs.    Referred patient to [facility]

## 2016-08-12 ENCOUNTER — Emergency Department
Admission: EM | Admit: 2016-08-12 | Discharge: 2016-08-12 | Disposition: A | Discharge status: Home / Self Care | Payer: No Typology Code available for payment source | Attending: Emergency Medicine | Admitting: Emergency Medicine

## 2016-08-12 ENCOUNTER — Encounter: Payer: Self-pay | Admitting: Emergency Medicine

## 2016-08-12 DIAGNOSIS — F3132 Bipolar disorder, current episode depressed, moderate: Secondary | ICD-10-CM

## 2016-08-12 DIAGNOSIS — F1220 Cannabis dependence, uncomplicated: Secondary | ICD-10-CM | POA: Diagnosis present

## 2016-08-12 DIAGNOSIS — F1410 Cocaine abuse, uncomplicated: Secondary | ICD-10-CM | POA: Diagnosis present

## 2016-08-12 DIAGNOSIS — F319 Bipolar disorder, unspecified: Secondary | ICD-10-CM | POA: Insufficient documentation

## 2016-08-12 DIAGNOSIS — Z91199 Patient's noncompliance with other medical treatment and regimen due to unspecified reason: Secondary | ICD-10-CM

## 2016-08-12 DIAGNOSIS — Z9119 Patient's noncompliance with other medical treatment and regimen: Secondary | ICD-10-CM

## 2016-08-12 DIAGNOSIS — R45851 Suicidal ideations: Secondary | ICD-10-CM | POA: Insufficient documentation

## 2016-08-12 DIAGNOSIS — F1320 Sedative, hypnotic or anxiolytic dependence, uncomplicated: Secondary | ICD-10-CM | POA: Diagnosis present

## 2016-08-12 DIAGNOSIS — F1390 Sedative, hypnotic, or anxiolytic use, unspecified, uncomplicated: Secondary | ICD-10-CM | POA: Insufficient documentation

## 2016-08-12 LAB — CBC WITH DIFFERENTIAL/PLATELET
BASOS ABS: 0.1 10*3/uL (ref 0–0.1)
BASOS PCT: 1 %
EOS ABS: 0 10*3/uL (ref 0–0.7)
EOS PCT: 1 %
HCT: 38.5 % (ref 35.0–47.0)
Hemoglobin: 13.3 g/dL (ref 12.0–16.0)
Lymphocytes Relative: 20 %
Lymphs Abs: 1.6 10*3/uL (ref 1.0–3.6)
MCH: 31 pg (ref 26.0–34.0)
MCHC: 34.5 g/dL (ref 32.0–36.0)
MCV: 89.8 fL (ref 80.0–100.0)
MONO ABS: 0.9 10*3/uL (ref 0.2–0.9)
MONOS PCT: 11 %
Neutro Abs: 5.6 10*3/uL (ref 1.4–6.5)
Neutrophils Relative %: 67 %
PLATELETS: 262 10*3/uL (ref 150–440)
RBC: 4.28 MIL/uL (ref 3.80–5.20)
RDW: 14 % (ref 11.5–14.5)
WBC: 8.2 10*3/uL (ref 3.6–11.0)

## 2016-08-12 LAB — COMPREHENSIVE METABOLIC PANEL
ALBUMIN: 4.7 g/dL (ref 3.5–5.0)
ALK PHOS: 55 U/L (ref 38–126)
ALT: 23 U/L (ref 14–54)
ANION GAP: 11 (ref 5–15)
AST: 26 U/L (ref 15–41)
BILIRUBIN TOTAL: 1 mg/dL (ref 0.3–1.2)
BUN: 18 mg/dL (ref 6–20)
CALCIUM: 9.5 mg/dL (ref 8.9–10.3)
CO2: 24 mmol/L (ref 22–32)
CREATININE: 0.82 mg/dL (ref 0.44–1.00)
Chloride: 105 mmol/L (ref 101–111)
GFR calc Af Amer: >60 mL/min (ref >60)
GFR calc non Af Amer: >60 mL/min (ref >60)
GLUCOSE: 84 mg/dL (ref 65–99)
Potassium: 3.6 mmol/L (ref 3.5–5.1)
Sodium: 140 mmol/L (ref 135–145)
TOTAL PROTEIN: 7.9 g/dL (ref 6.5–8.1)

## 2016-08-12 LAB — URINALYSIS, COMPLETE (UACMP) WITH MICROSCOPIC
BACTERIA UA: NONE SEEN
Bilirubin Urine: NEGATIVE
GLUCOSE, UA: NEGATIVE mg/dL
HGB URINE DIPSTICK: NEGATIVE
Ketones, ur: 20 mg/dL — AB
LEUKOCYTES UA: NEGATIVE
NITRITE: NEGATIVE
PROTEIN: NEGATIVE mg/dL
Specific Gravity, Urine: 1.028 (ref 1.005–1.030)
pH: 5 (ref 5.0–8.0)

## 2016-08-12 LAB — URINE DRUG SCREEN, QUALITATIVE (ARMC ONLY)
Amphetamines, Ur Screen: NOT DETECTED
BENZODIAZEPINE, UR SCRN: POSITIVE — AB
Barbiturates, Ur Screen: NOT DETECTED
CANNABINOID 50 NG, UR ~~LOC~~: POSITIVE — AB
COCAINE METABOLITE, UR ~~LOC~~: POSITIVE — AB
MDMA (Ecstasy)Ur Screen: NOT DETECTED
METHADONE SCREEN, URINE: NOT DETECTED
OPIATE, UR SCREEN: NOT DETECTED
PHENCYCLIDINE (PCP) UR S: NOT DETECTED
Tricyclic, Ur Screen: NOT DETECTED

## 2016-08-12 LAB — PHENYTOIN LEVEL, TOTAL: Phenytoin Lvl: <2.5 ug/mL — ABNORMAL LOW (ref 10.0–20.0)

## 2016-08-12 LAB — POCT PREGNANCY, URINE: PREG TEST UR: NEGATIVE

## 2016-08-12 LAB — ETHANOL: Alcohol, Ethyl (B): <5 mg/dL (ref <5)

## 2016-08-12 MED ORDER — ARIPIPRAZOLE 10 MG PO TABS: 10.0000 mg | ORAL_TABLET | Freq: Every day | ORAL | 1 refills | Status: DC

## 2016-08-12 MED ORDER — PHENYTOIN SODIUM EXTENDED 100 MG PO CAPS
400.0000 mg | ORAL_CAPSULE | Freq: Once | ORAL | Status: AC
  Administered 2016-08-12: 400 mg via ORAL
  Filled 2016-08-12: qty 4

## 2016-08-12 MED ORDER — PHENYTOIN SODIUM EXTENDED 100 MG PO CAPS: 300.0000 mg | ORAL_CAPSULE | Freq: Every day | ORAL | 1 refills | Status: DC

## 2016-08-12 MED ORDER — ACETAMINOPHEN 325 MG PO TABS
650.0000 mg | ORAL_TABLET | Freq: Once | ORAL | Status: AC
  Administered 2016-08-12: 650 mg via ORAL
  Filled 2016-08-12: qty 2

## 2016-08-12 MED ORDER — PHENYTOIN SODIUM EXTENDED 100 MG PO CAPS: 300.0000 mg | ORAL_CAPSULE | Freq: Every day | ORAL | Status: DC

## 2016-08-12 NOTE — ED Notes (Signed)
Pt discharged to home.  Family member driving.  Discharge instructions reviewed.  Verbalized understanding.  No questions or concerns at this time.  Teach back verified.  Pt in NAD.  No items left in ED.   

## 2016-08-12 NOTE — ED Triage Notes (Signed)
States has been having thoughts of harming self for over 1 month. States has plan of jumping off bridge. States has not done anything to harm self to this point.

## 2016-08-12 NOTE — Consult Note (Signed)
[facility] Consult   Reason for Consult:  Consult for 36 year old woman with a history of bipolar disorder and substance abuse Referring Physician:  Cinda Quest Patient Identification: Jennifer Woods MRN:  [id number] Principal Diagnosis: Bipolar 1 disorder, depressed, moderate ([place]) Diagnosis:   Patient Active Problem List   Diagnosis Date Noted  . Bipolar 1 disorder, depressed, moderate ([place]) [F31.32] 08/12/2016  . Sedative, hypnotic or anxiolytic use disorder, severe, dependence ([place]) [F13.20] 06/07/2016  . Cannabis use disorder, moderate, dependence ([place]) [F12.20] 06/07/2016  . Bipolar 1 disorder, mixed, severe ([place]) [F31.63] 06/06/2016  . Tobacco use disorder [F17.200] 06/06/2016  . Seizures ([place]) [R56.9] 06/05/2016  . Cocaine abuse [F14.10] 06/05/2016  . Noncompliance [Z91.19] 06/05/2016    Total Time spent with patient: 1 hour  Subjective:   Jennifer Woods is a 36 y.o. female patient admitted with "I've been off my medicine".  HPI:  Patient interviewed chart reviewed. 36 year old woman came voluntarily to the emergency room. She says that she has been off of her psychiatric medicine for 3-5 days. She stopped taking it because she said that her family told her that she was "like a zombie". However, without her medicine her mood feels worse. She starts to feel more anxious and depressed. Sleep becomes a little more erratic. She feels like her thoughts are racing although in conversation she is quite lucid. She says she has had some suicidal thoughts but has no intention of actually acting on it. She is not currently having any hallucinations or psychosis. Drug screen is positive for benzodiazepines cannabis and cocaine. Patient admits that she uses cannabis regularly. She thinks that it treats or seizures. She claims that a joint she smoked recently might of been laced which is her reason for having cocaine in the drug screen and admits that she took 1 Xanax recently.  Stresses are about the same as usual. Chronic conflict with her boyfriend and her living situation. Nothing new. Has not yet followed up with outpatient psychiatric treatment since her last admission.  Social history: Lives with her boyfriend and some of her boyfriend's extended family. Patient does not work outside the home. 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 She does take Dilantin regularly.  Substance abuse history: Recurrent problems with cocaine abuse. She had been abusing cocaine when she came to the hospital most recently. She claimed at that time that she had been off it for years. Looks like she is probably still using it. Also uses marijuana regularly and benzodiazepines when she can get her hands on them.  Past Psychiatric History: Patient carries a diagnosis of bipolar disorder. Her only serious suicide attempt was about 8-10 years ago. She says since then she has done a little bit of cutting but nothing worse than that. She has had suicidal thoughts but does not think she is going to act on it and has no intention or plan of it. She's been on multiple medicines over the years including multiple antipsychotics and antidepressants. She seems to frequently complain of side effects especially when she is outside of the hospital and has a long history of noncompliance.  Risk to Self: Is patient at risk for suicide?: Yes Risk to Others:   Prior Inpatient Therapy:   Prior Outpatient Therapy:    Past Medical History:  Past Medical History:  Diagnosis Date  . Bipolar 1 disorder ([place])   .  Seizures ([place])    History reviewed. No pertinent surgical history. Family History: No family history on file. Family Psychiatric  History: Family positive for bipolar disorder Social History:  History  Alcohol Use No     History  Drug Use  . Types: Marijuana    Social  History   Social History  . Marital status: Single    Spouse name: N/A  . Number of children: N/A  . Years of education: N/A   Social History Main Topics  . Smoking status: Current Every Day Smoker    Packs/day: 0.10    Types: Cigarettes  . Smokeless tobacco: Never Used  . Alcohol use No  . Drug use: Yes    Types: Marijuana  . Sexual activity: Not Asked   Other Topics Concern  . None   Social History Narrative  . None   Additional Social History:    Allergies:  No Known Allergies  Labs:  Results for orders placed or performed during the hospital encounter of 08/12/16 (from the past 48 hour(s))  Comprehensive metabolic panel     Status: None   Collection Time: 08/12/16  1:33 PM  Result Value Ref Range   Sodium 140 135 - 145 mmol/L   Potassium 3.6 3.5 - 5.1 mmol/L   Chloride 105 101 - 111 mmol/L   CO2 24 22 - 32 mmol/L   Glucose, Bld 84 65 - 99 mg/dL   BUN 18 6 - 20 mg/dL   Creatinine, Ser 0.82 0.44 - 1.00 mg/dL   Calcium 9.5 8.9 - 10.3 mg/dL   Total Protein 7.9 6.5 - 8.1 g/dL   Albumin 4.7 3.5 - 5.0 g/dL   AST 26 15 - 41 U/L   ALT 23 14 - 54 U/L   Alkaline Phosphatase 55 38 - 126 U/L   Total Bilirubin 1.0 0.3 - 1.2 mg/dL   GFR calc non Af Amer >60 >60 mL/min   GFR calc Af Amer >60 >60 mL/min    Comment: (NOTE) The eGFR has been calculated using the CKD EPI equation. This calculation has not been validated in all clinical situations. eGFR's persistently <60 mL/min signify possible Chronic Kidney Disease.    Anion gap 11 5 - 15  Ethanol     Status: None   Collection Time: 08/12/16  1:33 PM  Result Value Ref Range   Alcohol, Ethyl (B) <5 <5 mg/dL    Comment:        LOWEST DETECTABLE LIMIT FOR SERUM ALCOHOL IS 5 mg/dL FOR MEDICAL PURPOSES ONLY   Urine Drug Screen, Qualitative     Status: Abnormal   Collection Time: 08/12/16  1:33 PM  Result Value Ref Range   Tricyclic, Ur Screen NONE DETECTED NONE DETECTED   Amphetamines, Ur Screen NONE DETECTED NONE  DETECTED   MDMA (Ecstasy)Ur Screen NONE DETECTED NONE DETECTED   Cocaine Metabolite,Ur [place] POSITIVE (A) NONE DETECTED   Opiate, Ur Screen NONE DETECTED NONE DETECTED   Phencyclidine (PCP) Ur S NONE DETECTED NONE DETECTED   Cannabinoid 50 Ng, Ur [place] POSITIVE (A) NONE DETECTED   Barbiturates, Ur Screen NONE DETECTED NONE DETECTED   Benzodiazepine, Ur Scrn POSITIVE (A) NONE DETECTED   Methadone Scn, Ur NONE DETECTED NONE DETECTED    Comment: (NOTE) 008  Tricyclics, urine               Cutoff 1000 ng/mL 200  Amphetamines, urine             Cutoff 1000 ng/mL 300  MDMA (Ecstasy), urine           Cutoff 500 ng/mL 400  Cocaine Metabolite, urine       Cutoff 300 ng/mL 500  Opiate, urine                   Cutoff 300 ng/mL 600  Phencyclidine (PCP), urine      Cutoff 25 ng/mL 700  Cannabinoid, urine              Cutoff 50 ng/mL 800  Barbiturates, urine             Cutoff 200 ng/mL 900  Benzodiazepine, urine           Cutoff 200 ng/mL 1000 Methadone, urine                Cutoff 300 ng/mL 1100 1200 The urine drug screen provides only a preliminary, unconfirmed 1300 analytical test result and should not be used for non-medical 1400 purposes. Clinical consideration and professional judgment should 1500 be applied to any positive drug screen result due to possible 1600 interfering substances. A more specific alternate chemical method 1700 must be used in order to obtain a confirmed analytical result.  1800 Gas chromato graphy / mass spectrometry (GC/MS) is the preferred 1900 confirmatory method.   CBC with Diff     Status: None   Collection Time: 08/12/16  1:33 PM  Result Value Ref Range   WBC 8.2 3.6 - 11.0 K/uL   RBC 4.28 3.80 - 5.20 MIL/uL   Hemoglobin 13.3 12.0 - 16.0 g/dL   HCT 38.5 35.0 - 47.0 %   MCV 89.8 80.0 - 100.0 fL   MCH 31.0 26.0 - 34.0 pg   MCHC 34.5 32.0 - 36.0 g/dL   RDW 14.0 11.5 - 14.5 %   Platelets 262 150 - 440 K/uL   Neutrophils Relative % 67 %   Neutro Abs 5.6 1.4 -  6.5 K/uL   Lymphocytes Relative 20 %   Lymphs Abs 1.6 1.0 - 3.6 K/uL   Monocytes Relative 11 %   Monocytes Absolute 0.9 0.2 - 0.9 K/uL   Eosinophils Relative 1 %   Eosinophils Absolute 0.0 0 - 0.7 K/uL   Basophils Relative 1 %   Basophils Absolute 0.1 0 - 0.1 K/uL  Phenytoin level, total     Status: Abnormal   Collection Time: 08/12/16  1:33 PM  Result Value Ref Range   Phenytoin Lvl <2.5 (L) 10.0 - 20.0 ug/mL  Urinalysis, Complete w Microscopic     Status: Abnormal   Collection Time: 08/12/16  1:36 PM  Result Value Ref Range   Color, Urine YELLOW (A) YELLOW   APPearance CLOUDY (A) CLEAR   Specific Gravity, Urine 1.028 1.005 - 1.030   pH 5.0 5.0 - 8.0   Glucose, UA NEGATIVE NEGATIVE mg/dL   Hgb urine dipstick NEGATIVE NEGATIVE   Bilirubin Urine NEGATIVE NEGATIVE   Ketones, ur 20 (A) NEGATIVE mg/dL   Protein, ur NEGATIVE NEGATIVE mg/dL   Nitrite NEGATIVE NEGATIVE   Leukocytes, UA NEGATIVE NEGATIVE   RBC / HPF 0-5 0 - 5 RBC/hpf   WBC, UA 0-5 0 - 5 WBC/hpf   Bacteria, UA NONE SEEN NONE SEEN   Squamous Epithelial / LPF 0-5 (A) NONE SEEN   Mucous PRESENT    Uric Acid Crys, UA PRESENT   Pregnancy, urine POC     Status: None   Collection Time: 08/12/16  1:41 PM  Result Value Ref  Range   Preg Test, Ur NEGATIVE NEGATIVE    Comment:        THE SENSITIVITY OF THIS METHODOLOGY IS >24 mIU/mL     Current Facility-Administered Medications  Medication Dose Route Frequency Provider Last Rate Last Dose  . phenytoin (DILANTIN) ER capsule 300 mg  300 mg Oral QHS Schaevitz, Randall An, MD       Current Outpatient Prescriptions  Medication Sig Dispense Refill  . ARIPiprazole (ABILIFY) 10 MG tablet Take 1 tablet (10 mg total) by mouth daily. 30 tablet 1  . benztropine (COGENTIN) 0.5 MG tablet Take 1 tablet (0.5 mg total) by mouth 2 (two) times daily. 60 tablet 0  . phenytoin (DILANTIN) 100 MG ER capsule Take 300 mg by mouth at bedtime.    . ziprasidone (GEODON) 60 MG capsule Take 1  capsule (60 mg total) by mouth 2 (two) times daily with a meal. 60 capsule 0    Musculoskeletal: Strength & Muscle Tone: within normal limits Gait & Station: normal Patient leans: N/A  Psychiatric Specialty Exam: Physical Exam  ROS  Blood pressure 110/70, pulse (!) 52, temperature 98.3 F (36.8 C), temperature source Oral, resp. rate 14, height 5' (1.524 m), weight 165 lb (74.8 kg), last menstrual period 08/03/2016, SpO2 97 %.Body mass index is 32.22 kg/m.  General Appearance: Fairly Groomed  Eye Contact:  Good  Speech:  Clear and Coherent  Volume:  Normal  Mood:  Dysphoric  Affect:  Appropriate  Thought Process:  Goal Directed  Orientation:  Full (Time, Place, and Person)  Thought Content:  Logical  Suicidal Thoughts:  Yes.  without intent/plan  Homicidal Thoughts:  No  Memory:  Immediate;   Fair Recent;   Fair Remote;   Fair  Judgement:  Fair  Insight:  Fair  Psychomotor Activity:  Normal  Concentration:  Concentration: Fair  Recall:  [employer] of Knowledge:  Fair  Language:  Fair  Akathisia:  No  Handed:  Right  AIMS (if indicated):     Assets:  Desire for Improvement Financial Resources/Insurance Housing Physical Health Resilience Social Support  ADL's:  Intact  Cognition:  WNL  Sleep:        Treatment Plan Summary: Medication management and Plan 36 year old woman with a history of bipolar disorder also substance abuse and noncompliance. On interview today she seems to be actually relatively stable. She is able to engage in a very appropriate interview. No sign of derailment or thought distortion. No sign of psychosis. Affect remained appropriate and reactive. She admits that she is not seriously thinking of killing herself. We reviewed her past medicines and she ultimately agreed that the Abilify did seem to be of some help and was affordable to her. She claims that she had been taking 20 mg of it Abilify daily. I'm not sure if that is correct since Dr.  Jerilee Hoh note had said 10 mg a day. Nevertheless the patient is agreeable to going back on 10 mg a day. It sounds like it was certainly helping her feel better than she is now. Furthermore I have once again tried to impress upon her how drug abuse especially cocaine and benzodiazepines but also her chronic use of marijuana is making her mood problem worse. She will follow-up with [facility]. She is agreeable to discharge and feels comfortable with it. Case reviewed with emergency room physician.  Disposition: Patient does not meet criteria for psychiatric inpatient admission. Supportive therapy provided about ongoing stressors. Discussed crisis plan, support from social  network, calling 911, coming to the Emergency Department, and calling Suicide Hotline.  Alethia Berthold, MD 08/12/2016 4:11 PM

## 2016-08-12 NOTE — ED Notes (Signed)
Pt belongings (bag 1 of 1) returned to pt for discharge. Bag was emptied and contents placed on bed. 4 books total were included.

## 2016-08-12 NOTE — ED Notes (Signed)
Introduced self to pt and gave pt warm blanket. Talked with pt for a moment and pt is comfortable, has a paper back book she is reading and watching TV. Pt informed supper would be in approx. 1 hour

## 2016-08-12 NOTE — ED Provider Notes (Signed)
[facility] Emergency Department Provider Note  ____________________________________________   First MD Initiated Contact with Patient 08/12/16 1346     (approximate)  I have reviewed the triage vital signs and the nursing notes.   HISTORY  Chief Complaint Psychiatric Evaluation   HPI LATOIYA MARADIAGA is a 36 y.o. female with a history of bipolar disorder who is presenting to the emergency department with suicidal ideation, racing thoughts and auditory hallucinations. She says that she thinks she is having adverse reaction to Abilify. She says that she was started on Abilify one month ago and then for 2 weeks was very sedated. She says that because of that her family was "giving her a hard time." She says that over the past few days she has stopped taking her Abilify and now is feeling strangely. She says that she is hearing whispers and when she sees people speak she says different words come out of her mouth the mother actually saying. She says that she is now having some thoughts of jumping off a bridge.Denies any drinking or drug use.   Past Medical History:  Diagnosis Date  . Bipolar 1 disorder ([facility])   . Seizures [facility])     Patient Active Problem List   Diagnosis Date Noted  . Sedative, hypnotic or anxiolytic use disorder, severe, dependence ([facility]) 06/07/2016  . Cannabis use disorder, moderate, dependence ([facility]) 06/07/2016  . Bipolar 1 disorder, mixed, severe ([facility]) 06/06/2016  . Tobacco use disorder 06/06/2016  . Seizures ([facility]) 06/05/2016  . Cocaine abuse 06/05/2016  . Noncompliance 06/05/2016    History reviewed. No pertinent surgical history.  Prior to Admission medications   Medication Sig Start Date End Date Taking? Authorizing Provider  benztropine (COGENTIN) 0.5 MG tablet Take 1 tablet (0.5 mg total) by mouth 2 (two) times daily. 06/09/16   Jimmy Footman, MD  phenytoin (DILANTIN) 100 MG ER capsule Take 300 mg by mouth at  bedtime.    [provider]  ziprasidone (GEODON) 60 MG capsule Take 1 capsule (60 mg total) by mouth 2 (two) times daily with a meal. 06/09/16   Jimmy Footman, MD    Allergies Patient has no known allergies.  No family history on file.  Social History Social History  Substance Use Topics  . Smoking status: Current Every Day Smoker    Packs/day: 0.10    Types: Cigarettes  . Smokeless tobacco: Never Used  . Alcohol use No    Review of Systems  Constitutional: No fever/chills Eyes: No visual changes. ENT: No sore throat. Cardiovascular: Denies chest pain. Respiratory: Denies shortness of breath. Gastrointestinal: No abdominal pain.  No nausea, no vomiting.  No diarrhea.  No constipation. Genitourinary: Negative for dysuria. Musculoskeletal: Negative for back pain. Skin: Negative for rash. Neurological: Negative for headaches, focal weakness or numbness.   ____________________________________________   PHYSICAL EXAM:  VITAL SIGNS: ED Triage Vitals  Enc Vitals Group     BP 08/12/16 1329 120/80     Pulse Rate 08/12/16 1329 64     Resp 08/12/16 1329 18     Temp 08/12/16 1329 98.4 F (36.9 C)     Temp Source 08/12/16 1329 Oral     SpO2 08/12/16 1329 99 %     Weight 08/12/16 1329 165 lb (74.8 kg)     Height 08/12/16 1329 5' (1.524 m)     Head Circumference --      Peak Flow --      Pain Score 08/12/16 1328 6  Pain Loc --      Pain Edu? --      Excl. in GC? --     Constitutional: Alert and oriented. Well appearing and in no acute distress. Eyes: Conjunctivae are normal.  Head: Atraumatic. Nose: No congestion/rhinnorhea. Mouth/Throat: Mucous membranes are moist.  Neck: No stridor.   Cardiovascular: Normal rate, regular rhythm. Grossly normal heart sounds.   Respiratory: Normal respiratory effort.  No retractions. Lungs CTAB. Gastrointestinal: Soft and nontender. No distention.  Musculoskeletal: No lower extremity tenderness nor edema.   No joint effusions. Neurologic:  Normal speech and language. No gross focal neurologic deficits are appreciated. Skin:  Skin is warm, dry and intact. No rash noted. Psychiatric: Mood and affect are normal. Speech and behavior are normal.  ____________________________________________   LABS (all labs ordered are listed, but only abnormal results are displayed)  Labs Reviewed  COMPREHENSIVE METABOLIC PANEL  ETHANOL  URINE DRUG SCREEN, QUALITATIVE ([facility] ONLY)  CBC WITH DIFFERENTIAL/PLATELET  URINALYSIS, COMPLETE ([facility]) WITH MICROSCOPIC  POC URINE PREG, ED  POCT PREGNANCY, URINE   ____________________________________________  EKG   ____________________________________________  RADIOLOGY   ____________________________________________   PROCEDURES  Procedure(s) performed:   Procedures  Critical Care performed:   ____________________________________________   INITIAL IMPRESSION / ASSESSMENT AND PLAN / ED COURSE  Pertinent labs & imaging results that were available during my care of the patient were reviewed by me and considered in my medical decision making (see chart for details).  Patient to be involuntarily committed and to be evaluated by psychiatry patient understands the plan and willing to comply.      ____________________________________________   FINAL CLINICAL IMPRESSION(S) / ED DIAGNOSES  Psychosis    NEW MEDICATIONS STARTED DURING THIS VISIT:  New Prescriptions   No medications on file     Note:  This document was prepared using Dragon voice recognition software and may include unintentional dictation errors.     Myrna BlazerSchaevitz, David Matthew, MD 08/12/16 [id number]

## 2016-08-12 NOTE — ED Notes (Signed)
Pt dressed out into appropriate behavioral health clothing. Pt belongings consist of two hardback books, a paper back book, white tennis shoes, blue jeans, a grey t shirt, a black bra, dark blue leggings, four bracelets, a yellow ring with a blue stone, a yellow ring with a purple stone and several small clear stones and a yellow ring with several clear stones and two black hair ties. Pt has a paper back book with her that she wish to read while here. Book has been checked and nothing was found inside the book.

## 2016-08-12 NOTE — Discharge Instructions (Signed)
Please take your medicines as directed follow-up with [facility]. Temperature to keep your appointment there.

## 2016-08-12 NOTE — ED Notes (Signed)

## 2017-12-06 ENCOUNTER — Telehealth: Payer: Self-pay | Admitting: Pharmacy Technician

## 2017-12-06 NOTE — Telephone Encounter (Signed)
Patient failed to provide 2019 poi.  No additional medication assistance will be provided by [facility] without the required proof of income documentation.  Patient notified by letter.  Ayra Hodgdon J. Kiira Brach Care Manager [facility] 

## 2018-08-24 IMAGING — CT CT HEAD W/O CM
3 of 7 series · 15 of 47 positions shown, 18 images · non-contrast
Comparison: None.

CLINICAL DATA: Depression and insomnia. Hallucinations. Duration of
symptoms 1 week.

EXAM:
CT HEAD WITHOUT CONTRAST
TECHNIQUE: Contiguous axial images were obtained from the base of the skull
through the vertex without intravenous contrast.

[Series 2: head wo · axial · 0.41mm/px · z∈[-137,-22]mm · 10 of 29 slices shown, 13 images]
[im 3/29  brain]
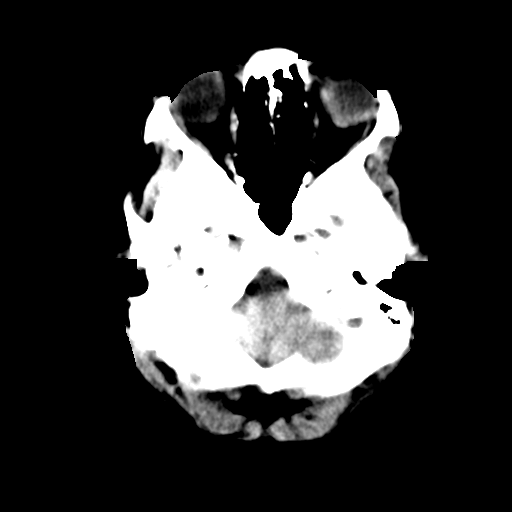
[im 3/29  bone]
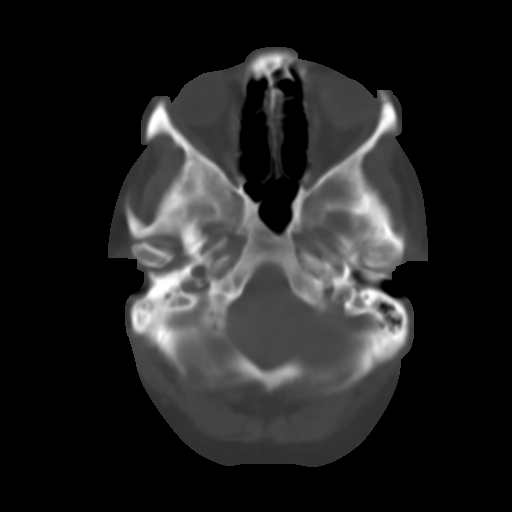
[im 6/29  brain]
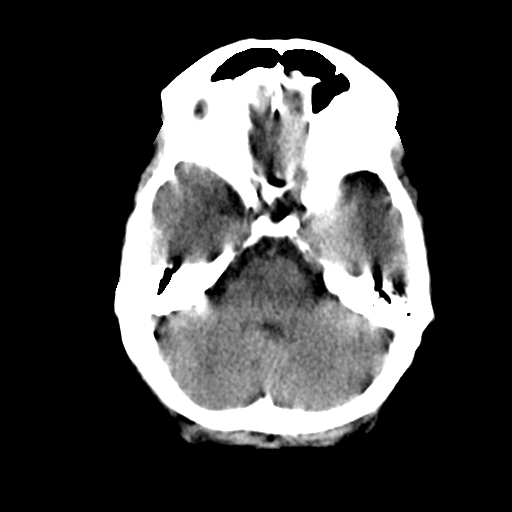
[im 8/29  brain]
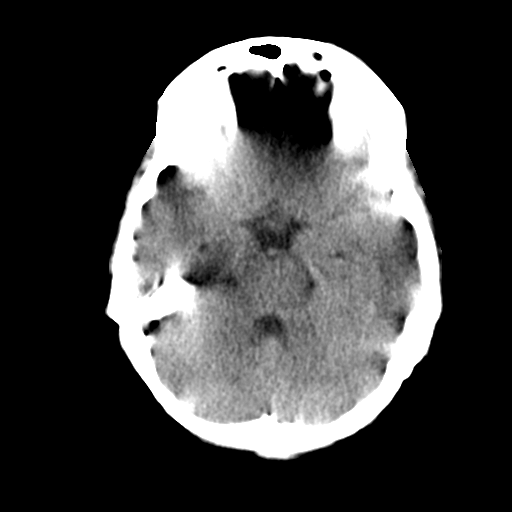
[im 11/29  brain]
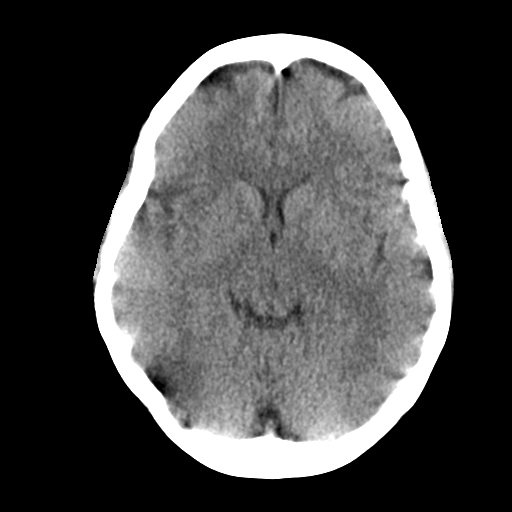
[im 13/29  brain]
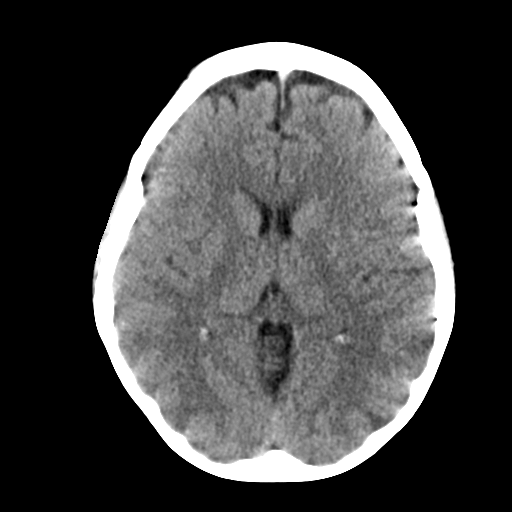
[im 13/29  bone]
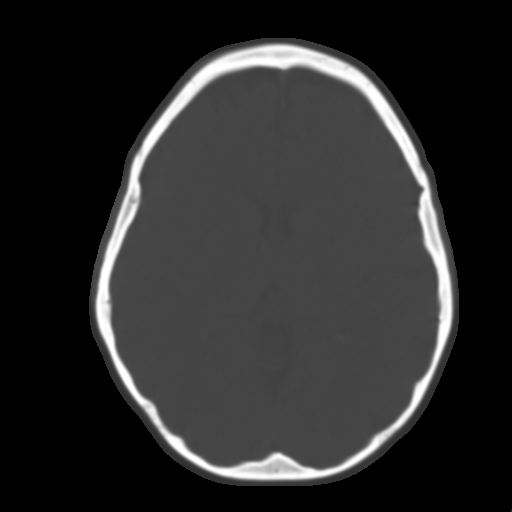
[im 16/29  brain]
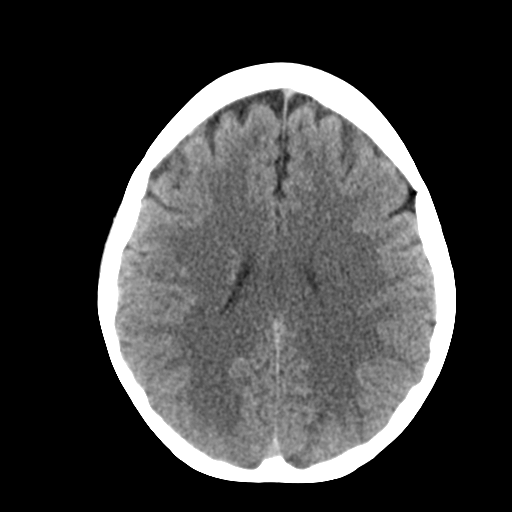
[im 18/29  brain]
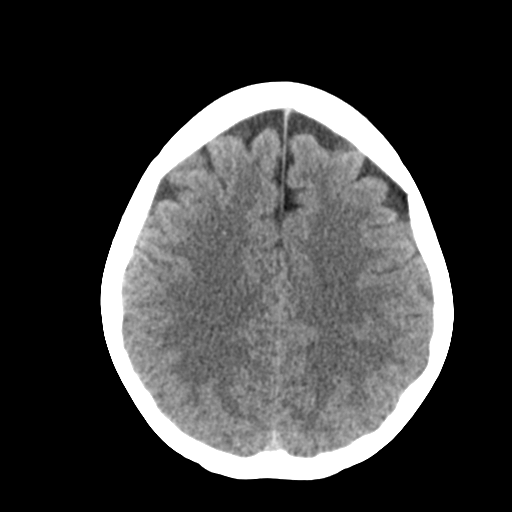
[im 21/29  brain]
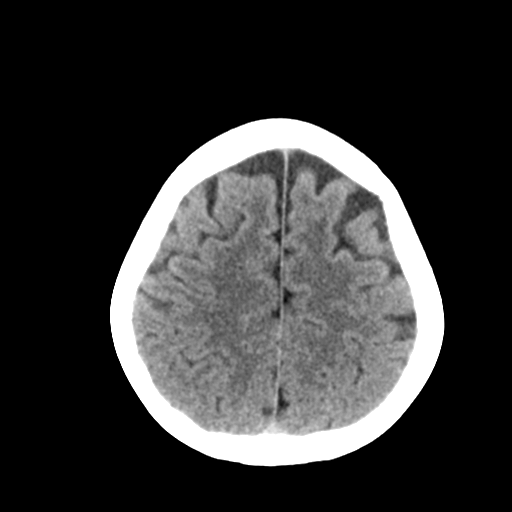
[im 23/29  brain]
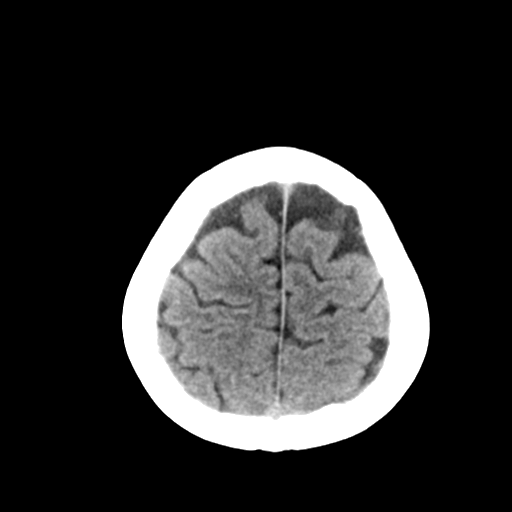
[im 23/29  bone]
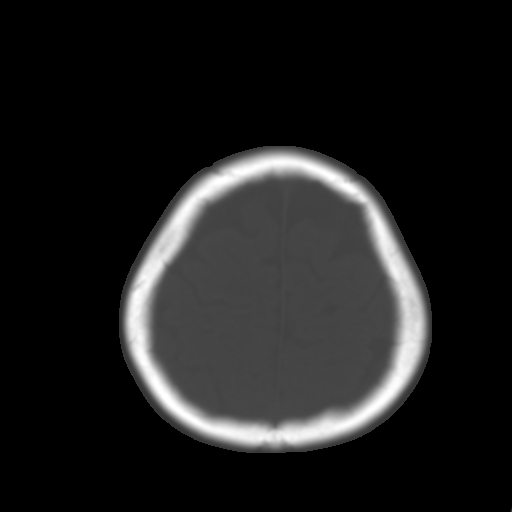
[im 26/29  brain]
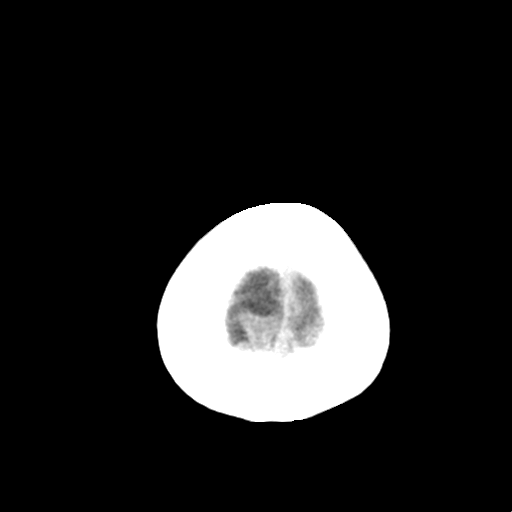

[Series 4: coronal soft tissue · coronal · 0.30mm/px · 3 of 63 slices shown]
[im 12/63  brain]
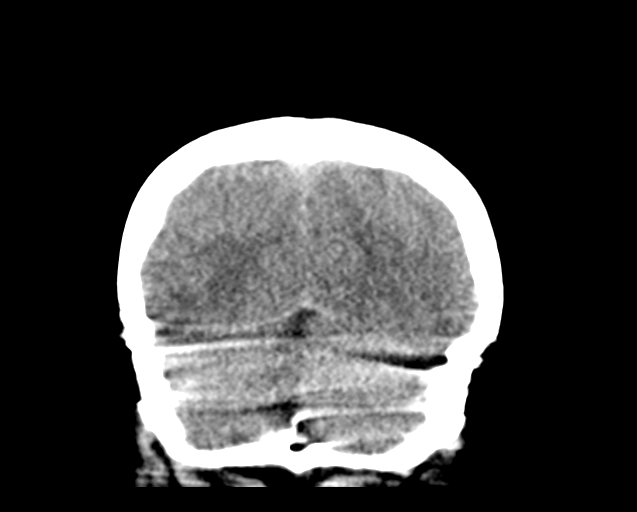
[im 18/63  brain]
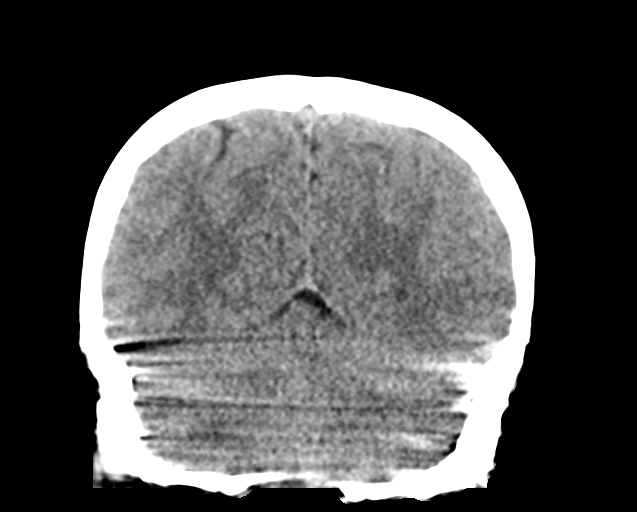
[im 24/63  brain]
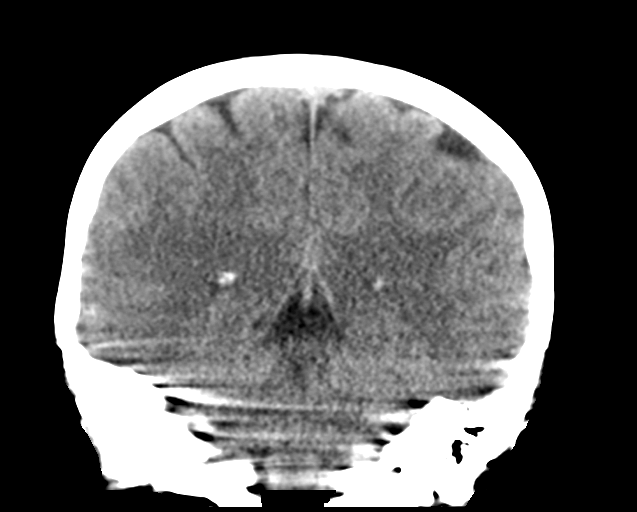

[Series 5: sagittal soft tissue · sagittal · 0.30mm/px · 2 of 52 slices shown]
[im 18/52  brain]
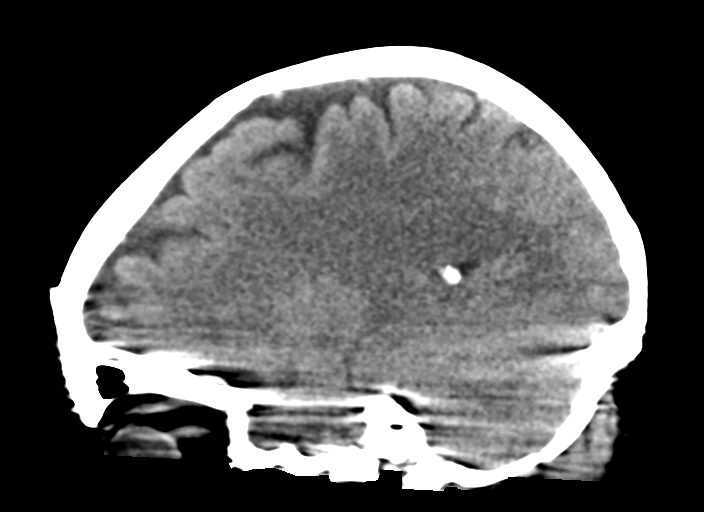
[im 35/52  brain]
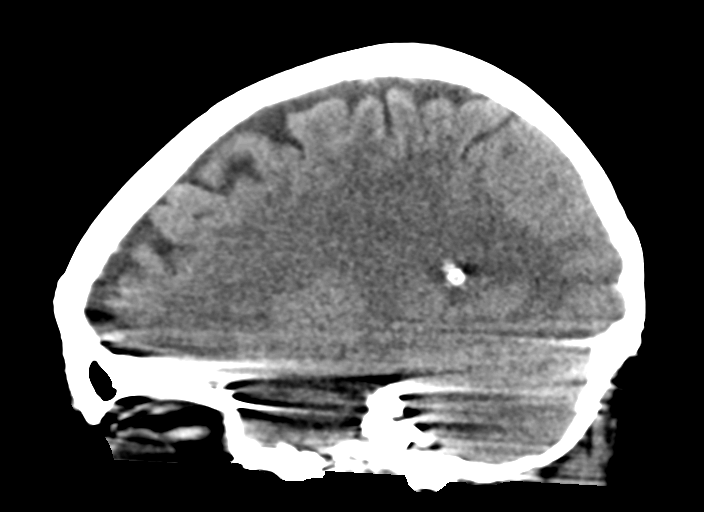

[15 of 47 positions shown; findings below may reference images not displayed]

FINDINGS: Brain: No evidence of malformation, atrophy, old or acute small or
large vessel infarction, mass lesion, hemorrhage, hydrocephalus or
extra-axial collection. No evidence of pituitary lesion.

Vascular: No vascular calcification.  No hyperdense vessels.

Skull: Normal.  No fracture or focal bone lesion.

Sinuses/Orbits: Visualized sinuses are clear. No fluid in the middle
ears or mastoids. Visualized orbits are normal.

Other: None significant
IMPRESSION: Normal head CT

## 2022-01-28 ENCOUNTER — Other Ambulatory Visit: Payer: Self-pay

## 2022-06-03 ENCOUNTER — Other Ambulatory Visit: Payer: Self-pay

## 2022-06-03 ENCOUNTER — Emergency Department: Payer: No Typology Code available for payment source

## 2022-06-03 ENCOUNTER — Emergency Department
Admission: EM | Admit: 2022-06-03 | Discharge: 2022-06-03 | Discharge status: Against Medical Advice | Payer: Self-pay | Attending: Emergency Medicine | Admitting: Emergency Medicine

## 2022-06-03 ENCOUNTER — Encounter: Payer: Self-pay | Admitting: Emergency Medicine

## 2022-06-03 DIAGNOSIS — M79644 Pain in right finger(s): Secondary | ICD-10-CM | POA: Insufficient documentation

## 2022-06-03 DIAGNOSIS — Z5321 Procedure and treatment not carried out due to patient leaving prior to being seen by health care provider: Secondary | ICD-10-CM | POA: Insufficient documentation

## 2022-06-03 DIAGNOSIS — R2231 Localized swelling, mass and lump, right upper limb: Secondary | ICD-10-CM | POA: Insufficient documentation

## 2022-06-03 NOTE — ED Notes (Signed)
No answer when called several times from lobby 

## 2022-06-03 NOTE — ED Notes (Signed)
No answer when called several times from lobby for ring removal

## 2022-06-03 NOTE — ED Triage Notes (Addendum)
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

## 2022-06-05 ENCOUNTER — Other Ambulatory Visit: Payer: Self-pay

## 2022-06-05 ENCOUNTER — Emergency Department: Payer: Self-pay

## 2022-06-05 ENCOUNTER — Encounter: Payer: Self-pay | Admitting: Emergency Medicine

## 2022-06-05 ENCOUNTER — Emergency Department
Admission: EM | Admit: 2022-06-05 | Discharge: 2022-06-05 | Disposition: A | Discharge status: Home / Self Care | Payer: Self-pay | Attending: Emergency Medicine | Admitting: Emergency Medicine

## 2022-06-05 ENCOUNTER — Emergency Department: Payer: No Typology Code available for payment source

## 2022-06-05 DIAGNOSIS — L03011 Cellulitis of right finger: Secondary | ICD-10-CM | POA: Insufficient documentation

## 2022-06-05 DIAGNOSIS — L03019 Cellulitis of unspecified finger: Secondary | ICD-10-CM

## 2022-06-05 DIAGNOSIS — S60444A External constriction of right ring finger, initial encounter: Secondary | ICD-10-CM | POA: Insufficient documentation

## 2022-06-05 DIAGNOSIS — W4904XA Ring or other jewelry causing external constriction, initial encounter: Secondary | ICD-10-CM | POA: Insufficient documentation

## 2022-06-05 DIAGNOSIS — Z716 Tobacco abuse counseling: Secondary | ICD-10-CM | POA: Insufficient documentation

## 2022-06-05 DIAGNOSIS — Q740 Other congenital malformations of upper limb(s), including shoulder girdle: Secondary | ICD-10-CM

## 2022-06-05 MED ORDER — CEPHALEXIN 500 MG PO CAPS
500.0000 mg | ORAL_CAPSULE | Freq: Once | ORAL | Status: AC
  Administered 2022-06-05: 500 mg via ORAL
  Filled 2022-06-05: qty 1

## 2022-06-05 MED ORDER — LIDOCAINE HCL (PF) 1 % IJ SOLN
10.0000 mL | Freq: Once | INTRAMUSCULAR | Status: AC
  Administered 2022-06-05: 10 mL via INTRADERMAL
  Filled 2022-06-05: qty 10

## 2022-06-05 MED ORDER — NICOTINE POLACRILEX 4 MG MT LOZG: 4.0000 mg | LOZENGE | OROMUCOSAL | 0 refills | Status: DC | PRN

## 2022-06-05 MED ORDER — LIDOCAINE HCL (PF) 1 % IJ SOLN
10.0000 mL | Freq: Once | INTRAMUSCULAR | Status: AC
  Administered 2022-06-05: 10 mL
  Filled 2022-06-05: qty 10

## 2022-06-05 MED ORDER — IBUPROFEN 600 MG PO TABS
600.0000 mg | ORAL_TABLET | Freq: Once | ORAL | Status: AC
  Administered 2022-06-05: 600 mg via ORAL
  Filled 2022-06-05: qty 1

## 2022-06-05 MED ORDER — ACETAMINOPHEN 500 MG PO TABS
1000.0000 mg | ORAL_TABLET | Freq: Once | ORAL | Status: AC
  Administered 2022-06-05: 1000 mg via ORAL
  Filled 2022-06-05: qty 2

## 2022-06-05 MED ORDER — NICOTINE 21 MG/24HR TD PT24
21.0000 mg | MEDICATED_PATCH | Freq: Once | TRANSDERMAL | Status: DC
  Administered 2022-06-05: 21 mg via TRANSDERMAL
  Filled 2022-06-05: qty 1

## 2022-06-05 MED ORDER — NICOTINE 21 MG/24HR TD PT24: 21.0000 mg | MEDICATED_PATCH | Freq: Every day | TRANSDERMAL | 3 refills | Status: AC

## 2022-06-05 MED ORDER — CEPHALEXIN 500 MG PO CAPS: 500.0000 mg | ORAL_CAPSULE | Freq: Four times a day (QID) | ORAL | 0 refills | Status: AC

## 2022-06-05 NOTE — ED Triage Notes (Signed)
Patient ambulatory to triage with steady gait, without difficulty or distress noted; pt here 4/24 but left immed after triage; pt returning for persistent rt thumb pain/swelling; pt still has metal band on thumb and st she is unable to remove it

## 2022-06-05 NOTE — Discharge Instructions (Signed)
Take antibiotics for the full course as prescribed.  Take acetaminophen 650 mg and ibuprofen 400 mg every 6 hours for pain.  Take with food.  Please keep your wound clean by washing at least daily with soap and water. If you see any signs of infection like spreading redness, pus coming from the wound, extreme pain, fevers, chills or any other worsening doctor right away or come back to the emergency department

## 2022-06-05 NOTE — ED Provider Notes (Signed)
[facility] Provider Note    Event Date/Time   First MD Initiated Contact with Patient 06/05/22 0211     (approximate)   History   Hand Pain   HPI  Jennifer Woods is a 42 y.o. female   Past medical history of bipolar and seizures who presents to the emergency department with some swelling after she had a small scrap of metal lodged in the pad of her thumb from welding.  She was able to remove the shard but over the last several days the thumb has swollen up and she is having difficulty removing her ring.  She has had no systemic symptoms like fevers or chills.  External Medical Documents Reviewed: Emergency department visit dated July 2018 for psychiatric evaluation      Physical Exam   Triage Vital Signs: ED Triage Vitals  Enc Vitals Group     BP --      Pulse Rate 06/05/22 0213 98     Resp 06/05/22 0213 18     Temp 06/05/22 0213 98.1 F (36.7 C)     Temp Source 06/05/22 0213 Oral     SpO2 06/05/22 0213 98 %     Weight 06/05/22 0212 164 lb 14.5 oz (74.8 kg)     Height 06/05/22 0212 5' (1.524 m)     Head Circumference --      Peak Flow --      Pain Score 06/05/22 0211 8     Pain Loc --      Pain Edu? --      Excl. in GC? --     Most recent vital signs: Vitals:   06/05/22 0213  Pulse: 98  Resp: 18  Temp: 98.1 F (36.7 C)  SpO2: 98%    General: Awake, no distress.  CV:  Good peripheral perfusion.  Resp:  Normal effort.  Abd:  No distention.  Other:  Alert comfortable appearing she has a swollen right thumb with a ring just proximal.  She has sensation.  There is no red streaking.  There is a small abrasion and tense fluctuance on the pad of the thumb but no obvious foreign body on my visual exam.   ED Results / Procedures / Treatments   Labs (all labs ordered are listed, but only abnormal results are displayed) Labs Reviewed - No data to display    RADIOLOGY I independently reviewed and interpreted x-ray of the thumb  and see no obvious foreign body   PROCEDURES:  Critical Care performed: No  ..Incision and Drainage  Date/Time: 06/05/2022 4:39 AM  Performed by: Pilar Jarvis, MD Authorized by: Pilar Jarvis, MD   Consent:    Consent obtained:  Verbal   Consent given by:  Patient   Risks, benefits, and alternatives were discussed: yes     Risks discussed:  Bleeding, incomplete drainage, infection, pain and damage to other organs   Alternatives discussed:  No treatment and delayed treatment Universal protocol:    Procedure explained and questions answered to patient or proxy's satisfaction: yes     Patient identity confirmed:  Verbally with patient Location:    Type:  Abscess   Size:  1cm   Location:  Upper extremity   Upper extremity location:  Finger   Finger location:  R thumb Pre-procedure details:    Skin preparation:  Chlorhexidine with alcohol Sedation:    Sedation type:  None Anesthesia:    Anesthesia method:  Nerve block   Block needle  gauge:  25 G   Block anesthetic:  Lidocaine 1% w/o epi   Block technique:  Digital block   Block injection procedure:  Anatomic landmarks identified, introduced needle, incremental injection, negative aspiration for blood and anatomic landmarks palpated   Block outcome:  Anesthesia achieved Procedure type:    Complexity:  Simple Procedure details:    Ultrasound guidance: no     Incision types:  Stab incision   Incision depth:  Dermal   Drainage:  Purulent   Drainage amount:  Scant   Wound treatment:  Wound left open   Packing materials:  None Post-procedure details:    Procedure completion:  Tolerated with difficulty    MEDICATIONS ORDERED IN ED: Medications  nicotine (NICODERM CQ - dosed in mg/24 hours) patch 21 mg (21 mg Transdermal Patch Applied 06/05/22 0255)  lidocaine (PF) (XYLOCAINE) 1 % injection 10 mL (10 mLs Intradermal Given by Other 06/05/22 0234)  cephALEXin (KEFLEX) capsule 500 mg (500 mg Oral Given 06/05/22 0234)   acetaminophen (TYLENOL) tablet 1,000 mg (1,000 mg Oral Given 06/05/22 0252)  ibuprofen (ADVIL) tablet 600 mg (600 mg Oral Given 06/05/22 0252)  lidocaine (PF) (XYLOCAINE) 1 % injection 10 mL (10 mLs Other Given by Other 06/05/22 0356)     IMPRESSION / MDM / ASSESSMENT AND PLAN / ED COURSE  I reviewed the triage vital signs and the nursing notes.                                Patient's presentation is most consistent with acute presentation with potential threat to life or bodily function.  Differential diagnosis includes, but is not limited to, infection, abscess, ischemic finger, ring removal   MDM: Patient with a metal shard in the pad of her thumb that has become swollen and painful now difficult to remove her ring.  Ring removal as above.  X-ray to identify foreign body, though none on my visual exam.  Given the pain and foreign body recently removed and swelling will cover for skin infection.    Despite very good analgesia the patient was very anxious during the procedure and could not hold her arm still, I was able to perform a longitudinal stab incision at the area of greatest fluctuance on the pad of the right thumb and illicit a moderate amount of purulent drainage.  Ideally I would have been able to probe and deloculated but given the patient's hesitancy with this procedure I was unable to do so.  She will be discharged on antibiotics and follow-up with her primary doctor for wound check. -- I spent 5 minutes counseling this patient on smoking cessation.  We spoke about the patient's current tobacco use, impact of smoking, assessed willingness to quit, methods for cessation including medical management and nicotine replacement therapy (which I prescribed to the patient) and advised follow-up with primary doctor to continue to address smoking cessation.      FINAL CLINICAL IMPRESSION(S) / ED DIAGNOSES   Final diagnoses:  Cellulitis of finger of right hand  Constriction ring  of upper extremity  Encounter for smoking cessation counseling  Felon of finger     Rx / DC Orders   ED Discharge Orders          Ordered    cephALEXin (KEFLEX) 500 MG capsule  4 times daily        06/05/22 0233    nicotine polacrilex (NICOTINE MINI) 4 MG lozenge  As needed        06/05/22 0250    nicotine (NICODERM CQ - DOSED IN MG/24 HOURS) 21 mg/24hr patch  Daily        06/05/22 0250             Note:  This document was prepared using Dragon voice recognition software and may include unintentional dictation errors.    Pilar Jarvis, MD 06/05/22 [phone]

## 2023-07-14 ENCOUNTER — Other Ambulatory Visit: Payer: Self-pay

## 2023-07-14 ENCOUNTER — Emergency Department
Admission: EM | Admit: 2023-07-14 | Discharge: 2023-07-14 | Disposition: A | Discharge status: Home / Self Care | Payer: MEDICAID | Attending: Emergency Medicine | Admitting: Emergency Medicine

## 2023-07-14 ENCOUNTER — Encounter: Payer: Self-pay | Admitting: Emergency Medicine

## 2023-07-14 DIAGNOSIS — B9689 Other specified bacterial agents as the cause of diseases classified elsewhere: Secondary | ICD-10-CM | POA: Insufficient documentation

## 2023-07-14 DIAGNOSIS — H66006 Acute suppurative otitis media without spontaneous rupture of ear drum, recurrent, bilateral: Secondary | ICD-10-CM | POA: Diagnosis not present

## 2023-07-14 DIAGNOSIS — H5789 Other specified disorders of eye and adnexa: Secondary | ICD-10-CM | POA: Diagnosis present

## 2023-07-14 DIAGNOSIS — H1031 Unspecified acute conjunctivitis, right eye: Secondary | ICD-10-CM | POA: Insufficient documentation

## 2023-07-14 MED ORDER — ERYTHROMYCIN 5 MG/GM OP OINT
TOPICAL_OINTMENT | Freq: Four times a day (QID) | OPHTHALMIC | Status: DC
  Administered 2023-07-14: 1 via OPHTHALMIC
  Filled 2023-07-14: qty 1

## 2023-07-14 MED ORDER — AZITHROMYCIN 500 MG PO TABS
500.0000 mg | ORAL_TABLET | Freq: Once | ORAL | Status: DC
  Filled 2023-07-14: qty 1

## 2023-07-14 MED ORDER — GENTAMICIN SULFATE 0.3 % OP SOLN
1.0000 [drp] | Freq: Three times a day (TID) | OPHTHALMIC | 0 refills | Status: AC
  Filled 2023-07-14: qty 5, 34d supply, fill #0

## 2023-07-14 MED ORDER — AMOXICILLIN-POT CLAVULANATE 875-125 MG PO TABS
1.0000 | ORAL_TABLET | Freq: Once | ORAL | Status: AC
  Administered 2023-07-14: 1 via ORAL
  Filled 2023-07-14: qty 1

## 2023-07-14 MED ORDER — AMOXICILLIN-POT CLAVULANATE 875-125 MG PO TABS
1.0000 | ORAL_TABLET | Freq: Two times a day (BID) | ORAL | 0 refills | Status: AC
  Filled 2023-07-14: qty 20, 10d supply, fill #0

## 2023-07-14 NOTE — Discharge Instructions (Addendum)
 You are being treated for a acute on chronic otitis media as well as conjunctivitis of the right eye.  Use the eyedrops as directed.  Avoid touching the tip of the medicine dropper to your eyelid or lashes.  Take antibiotic twice daily until all pills are gone.  Follow-up with [facility] or [facility] urgent care for ongoing evaluation.

## 2023-07-14 NOTE — ED Provider Notes (Signed)
 [facility] Emergency Department Provider Note     Event Date/Time   First MD Initiated Contact with Patient 07/14/23 1109     (approximate)   History   Conjunctivitis   HPI  Jennifer Woods is a 43 y.o. female with a history of bipolar disorder and seizures, who presents endorsing right eye redness and irritation.  Patient reports onset of symptoms upon awakening this morning.  No trauma, foreign body sensation, visual change reported.  No headache, nausea, dizziness, on vision loss reported.     Physical Exam   Triage Vital Signs: ED Triage Vitals  Encounter Vitals Group     BP 07/14/23 1107 131/74     Systolic BP Percentile --      Diastolic BP Percentile --      Pulse Rate 07/14/23 1107 84     Resp 07/14/23 1107 16     Temp 07/14/23 1107 98.6 F (37 C)     Temp Source 07/14/23 1107 Oral     SpO2 07/14/23 1107 98 %     Weight 07/14/23 1106 165 lb 5.5 oz (75 kg)     Height 07/14/23 1106 5' (1.524 m)     Head Circumference --      Peak Flow --      Pain Score 07/14/23 1106 5     Pain Loc --      Pain Education --      Exclude from Growth Chart --     Most recent vital signs: Vitals:   07/14/23 1107  BP: 131/74  Pulse: 84  Resp: 16  Temp: 98.6 F (37 C)  SpO2: 98%    General Awake, no distress. NAD HEENT NCAT. PERRL. EOMI. conjunctiva on the right is injected.  Copious drainage noted.  No rhinorrhea. Mucous membranes are moist.  Bilateral TMs are retracted, erythematous, and a purulent effusion is noted CV:  Good peripheral perfusion.  RESP:  Normal effort.    ED Results / Procedures / Treatments   Labs (all labs ordered are listed, but only abnormal results are displayed) Labs Reviewed - No data to display   EKG   RADIOLOGY  No results found.   PROCEDURES:  Critical Care performed: No  Procedures   MEDICATIONS ORDERED IN ED: Medications  amoxicillin -clavulanate (AUGMENTIN ) 875-125 MG per tablet 1  tablet (1 tablet Oral Given 07/14/23 1159)     IMPRESSION / MDM / ASSESSMENT AND PLAN / ED COURSE  I reviewed the triage vital signs and the nursing notes.                              Differential diagnosis includes, but is not limited to, conjunctivitis, corneal abrasion, corneal foreign body, chemosis, blepharitis  Patient's presentation is most consistent with acute, uncomplicated illness.  Patient's diagnosis is consistent with acute bacterial conjunctivitis on the right, and bilateral recurrent AOM. Patient will be discharged home with prescriptions for Augmentin  and gentamicin  ophthalmic solution. Patient is to follow up with her PCP or ophthalmologist as discussed, as needed or otherwise directed. Patient is given ED precautions to return to the ED for any worsening or new symptoms.     FINAL CLINICAL IMPRESSION(S) / ED DIAGNOSES   Final diagnoses:  Acute bacterial conjunctivitis of right eye  Recurrent acute suppurative otitis media without spontaneous rupture of tympanic membrane of both sides     Rx / DC Orders   ED  Discharge Orders          Ordered    gentamicin  (GARAMYCIN ) 0.3 % ophthalmic solution  3 times daily        07/14/23 1156    amoxicillin -clavulanate (AUGMENTIN ) 875-125 MG tablet  2 times daily        07/14/23 1156             Note:  This document was prepared using Dragon voice recognition software and may include unintentional dictation errors.    May Sparks, PA-C 07/17/23 2211    Bryson Carbine, MD 07/18/23 1124

## 2023-07-14 NOTE — ED Triage Notes (Signed)
 Pt woke up this morning with right eye redness and swelling.

## 2023-07-24 ENCOUNTER — Other Ambulatory Visit: Payer: Self-pay

## 2023-07-26 ENCOUNTER — Other Ambulatory Visit: Payer: Self-pay

## 2023-09-03 ENCOUNTER — Encounter: Payer: Self-pay | Admitting: Emergency Medicine

## 2023-09-03 ENCOUNTER — Emergency Department
Admission: EM | Admit: 2023-09-03 | Discharge: 2023-09-03 | Discharge status: Against Medical Advice | Payer: MEDICAID | Source: Ambulatory Visit | Attending: Emergency Medicine | Admitting: Emergency Medicine

## 2023-09-03 ENCOUNTER — Other Ambulatory Visit: Payer: Self-pay

## 2023-09-03 DIAGNOSIS — Z5321 Procedure and treatment not carried out due to patient leaving prior to being seen by health care provider: Secondary | ICD-10-CM | POA: Insufficient documentation

## 2023-09-03 DIAGNOSIS — N939 Abnormal uterine and vaginal bleeding, unspecified: Secondary | ICD-10-CM | POA: Diagnosis present

## 2023-09-03 HISTORY — DX: Autistic disorder: F84.0

## 2023-09-03 LAB — BASIC METABOLIC PANEL WITH GFR
Anion gap: 11 (ref 5–15)
BUN: 17 mg/dL (ref 6–20)
CO2: 23 mmol/L (ref 22–32)
Calcium: 9.1 mg/dL (ref 8.9–10.3)
Chloride: 106 mmol/L (ref 98–111)
Creatinine, Ser: 0.61 mg/dL (ref 0.44–1.00)
GFR, Estimated: >60 mL/min (ref >60)
Glucose, Bld: 92 mg/dL (ref 70–99)
Potassium: 4.1 mmol/L (ref 3.5–5.1)
Sodium: 140 mmol/L (ref 135–145)

## 2023-09-03 LAB — CBC
HCT: 40.6 % (ref 36.0–46.0)
Hemoglobin: 13.6 g/dL (ref 12.0–15.0)
MCH: 30.3 pg (ref 26.0–34.0)
MCHC: 33.5 g/dL (ref 30.0–36.0)
MCV: 90.4 fL (ref 80.0–100.0)
Platelets: 242 K/uL (ref 150–400)
RBC: 4.49 MIL/uL (ref 3.87–5.11)
RDW: 12.3 % (ref 11.5–15.5)
WBC: 6.1 K/uL (ref 4.0–10.5)
nRBC: 0 % (ref 0.0–0.2)

## 2023-09-03 NOTE — ED Notes (Signed)
Called 3x for treatment room. Eloped from waiting area.  

## 2023-09-03 NOTE — ED Triage Notes (Signed)
 First nurse note: Pt to ED via [facility]. PT sent due to heavy bleeding and passing large clots

## 2023-09-03 NOTE — ED Triage Notes (Signed)
 AAOx3.  Skin warm and dry. NAD.  History of ovarian cysts.

## 2023-09-09 ENCOUNTER — Encounter: Payer: Self-pay | Admitting: Emergency Medicine

## 2023-09-09 ENCOUNTER — Emergency Department
Admission: EM | Admit: 2023-09-09 | Discharge: 2023-09-11 | Disposition: A | Discharge status: Other Acute Inpatient Hospital | Payer: MEDICAID | Attending: Emergency Medicine | Admitting: Emergency Medicine

## 2023-09-09 ENCOUNTER — Emergency Department: Payer: MEDICAID

## 2023-09-09 ENCOUNTER — Other Ambulatory Visit: Payer: Self-pay

## 2023-09-09 DIAGNOSIS — R4689 Other symptoms and signs involving appearance and behavior: Secondary | ICD-10-CM | POA: Insufficient documentation

## 2023-09-09 DIAGNOSIS — Z91199 Patient's noncompliance with other medical treatment and regimen due to unspecified reason: Secondary | ICD-10-CM

## 2023-09-09 DIAGNOSIS — D649 Anemia, unspecified: Secondary | ICD-10-CM | POA: Insufficient documentation

## 2023-09-09 DIAGNOSIS — R569 Unspecified convulsions: Secondary | ICD-10-CM | POA: Diagnosis present

## 2023-09-09 DIAGNOSIS — F1410 Cocaine abuse, uncomplicated: Secondary | ICD-10-CM | POA: Diagnosis present

## 2023-09-09 DIAGNOSIS — F3163 Bipolar disorder, current episode mixed, severe, without psychotic features: Secondary | ICD-10-CM | POA: Diagnosis present

## 2023-09-09 LAB — CBC WITH DIFFERENTIAL/PLATELET
Abs Immature Granulocytes: 0.02 K/uL (ref 0.00–0.07)
Basophils Absolute: 0 K/uL (ref 0.0–0.1)
Basophils Relative: 1 %
Eosinophils Absolute: 0.1 K/uL (ref 0.0–0.5)
Eosinophils Relative: 2 %
HCT: 35.1 % — ABNORMAL LOW (ref 36.0–46.0)
Hemoglobin: 11.7 g/dL — ABNORMAL LOW (ref 12.0–15.0)
Immature Granulocytes: 1 %
Lymphocytes Relative: 33 %
Lymphs Abs: 1.4 K/uL (ref 0.7–4.0)
MCH: 30.7 pg (ref 26.0–34.0)
MCHC: 33.3 g/dL (ref 30.0–36.0)
MCV: 92.1 fL (ref 80.0–100.0)
Monocytes Absolute: 0.4 K/uL (ref 0.1–1.0)
Monocytes Relative: 10 %
Neutro Abs: 2.4 K/uL (ref 1.7–7.7)
Neutrophils Relative %: 53 %
Platelets: 214 K/uL (ref 150–400)
RBC: 3.81 MIL/uL — ABNORMAL LOW (ref 3.87–5.11)
RDW: 12.2 % (ref 11.5–15.5)
WBC: 4.3 K/uL (ref 4.0–10.5)
nRBC: 0 % (ref 0.0–0.2)

## 2023-09-09 LAB — COMPREHENSIVE METABOLIC PANEL WITH GFR
ALT: 19 U/L (ref 0–44)
AST: 19 U/L (ref 15–41)
Albumin: 3.3 g/dL — ABNORMAL LOW (ref 3.5–5.0)
Alkaline Phosphatase: 41 U/L (ref 38–126)
Anion gap: 10 (ref 5–15)
BUN: 16 mg/dL (ref 6–20)
CO2: 28 mmol/L (ref 22–32)
Calcium: 8.7 mg/dL — ABNORMAL LOW (ref 8.9–10.3)
Chloride: 103 mmol/L (ref 98–111)
Creatinine, Ser: 0.68 mg/dL (ref 0.44–1.00)
GFR, Estimated: >60 mL/min (ref >60)
Glucose, Bld: 89 mg/dL (ref 70–99)
Potassium: 3.6 mmol/L (ref 3.5–5.1)
Sodium: 141 mmol/L (ref 135–145)
Total Bilirubin: 0.3 mg/dL (ref 0.0–1.2)
Total Protein: 5.9 g/dL — ABNORMAL LOW (ref 6.5–8.1)

## 2023-09-09 LAB — URINE DRUG SCREEN, QUALITATIVE (ARMC ONLY)
Amphetamines, Ur Screen: NOT DETECTED
Barbiturates, Ur Screen: NOT DETECTED
Benzodiazepine, Ur Scrn: NOT DETECTED
Cannabinoid 50 Ng, Ur ~~LOC~~: POSITIVE — AB
Cocaine Metabolite,Ur ~~LOC~~: POSITIVE — AB
MDMA (Ecstasy)Ur Screen: NOT DETECTED
Methadone Scn, Ur: NOT DETECTED
Opiate, Ur Screen: NOT DETECTED
Phencyclidine (PCP) Ur S: NOT DETECTED
Tricyclic, Ur Screen: NOT DETECTED

## 2023-09-09 LAB — ETHANOL: Alcohol, Ethyl (B): <15 mg/dL (ref <15)

## 2023-09-09 LAB — HCG, QUANTITATIVE, PREGNANCY: hCG, Beta Chain, Quant, S: 1 m[IU]/mL (ref <5)

## 2023-09-09 LAB — TROPONIN I (HIGH SENSITIVITY): Troponin I (High Sensitivity): <2 ng/L (ref <18)

## 2023-09-09 MED ORDER — SODIUM CHLORIDE 0.9 % IV BOLUS
1000.0000 mL | Freq: Once | INTRAVENOUS | Status: AC
  Administered 2023-09-09: 1000 mL via INTRAVENOUS

## 2023-09-09 NOTE — ED Notes (Signed)
 Ray, MD, made aware of BP 76/52 (60). 1000 mL NS bolus ordered.

## 2023-09-09 NOTE — Consult Note (Signed)
 Patient is a 43 year old female who currently lives with her significant other she has significant challenges with cocaine opiate and cannabis abuse reportedly also has seizure disorder but reportedly has not been compliant with medications her son died couple of years ago and she started thinking about this after simply brought up and relapsed on drugs for past 8 days reportedly she has not been using any medication but I buying some cocaine and Suboxone from the street and has been using and having significant perceptual disturbances and confusion.  There is a history of bipolar disorder and she reportedly had seizures several years ago in 2022 and was on Dilantin  she has not followed up for seizures.  Patient is unable to engage in a meaningful interview at this time.  Status examination could not be assessed because of her mentation she is extremely confused and sedated  Assessment acute encephalopathy/sedative and hypnotic intoxication/rule out opioid intoxication/drug-induced psychosis  The differential diagnosis is little bit broad at this time but if she is medically cleared she will need inpatient psychiatric hospitalization  Will recommend stopping Geodon  and Abilify  and starting Haldol 5 mg p.o. twice daily if the QTc is below 450 and also start her on Neurontin 600 mg p.o. 4 times a day for drug withdrawal/intoxication and seizure precautions  Recommend IVC    No current facility-administered medications for this encounter.   Current Outpatient Medications  Medication Sig Dispense Refill   Acetaminophen  (TYLENOL  PO) Take by mouth.     amoxicillin -clavulanate (AUGMENTIN ) 875-125 MG tablet Take 1 tablet by mouth 2 (two) times daily.     gentamicin  (GARAMYCIN ) 0.3 % ophthalmic solution in the morning, at noon, and at bedtime.     IBUPROFEN -ACETAMINOPHEN  PO Take by mouth.     ARIPiprazole  (ABILIFY ) 10 MG tablet Take 1 tablet (10 mg total) by mouth daily. (Patient not taking: Reported  on 09/09/2023) 30 tablet 1   benztropine  (COGENTIN ) 0.5 MG tablet Take 1 tablet (0.5 mg total) by mouth 2 (two) times daily. (Patient not taking: Reported on 09/09/2023) 60 tablet 0   nicotine  polacrilex (NICOTINE  MINI) 4 MG lozenge Take 1 lozenge (4 mg total) by mouth as needed. 100 tablet 0   phenytoin  (DILANTIN ) 100 MG ER capsule Take 3 capsules (300 mg total) by mouth at bedtime. (Patient not taking: Reported on 09/09/2023) 90 capsule 1   ziprasidone  (GEODON ) 60 MG capsule Take 1 capsule (60 mg total) by mouth 2 (two) times daily with a meal. (Patient not taking: Reported on 09/09/2023) 60 capsule 0

## 2023-09-09 NOTE — ED Triage Notes (Signed)
 Pt via POV from home. Pt states she possibly had a seizure yesterday in the bathroom. Pt has a hx of seizures, pt suppose to take Dilantin  but hasn't taken them in the past couple of weeks. Per family, pt has been addicted to narcotics and occasionally uses CBD, denies any substance use. Last use of narcotics was last night possibly. Pt is alert but confused and has slurred speech.

## 2023-09-09 NOTE — ED Notes (Signed)
 Pt dressed out in hospital scrubs by this RN and Jacquline, [place]. Pt belongings given to significant other to take home.

## 2023-09-09 NOTE — ED Provider Notes (Signed)
-----------------------------------------   10:04 PM on 09/09/2023 ----------------------------------------- Patient has continued to have fairly low blood pressures.  She was given a liter of fluid through her IV.  Patient remains very somnolent she will wake up to physical stimuli or strong verbal stimuli but then falls back asleep.  I have sent a troponin as a precaution.  Patient's workup is overall reassuring.  Hypotension may very well be related to the patient's mental status she is very somnolent and difficult to keep awake.  Will continue to closely monitor in the emergency department.  Lab work shows a normal CBC normal chemistry negative alcohol negative pregnancy test cocaine and cannabinoid positive urine drug screen.  The troponin which was just sent is pending.   Dorothyann Drivers, MD 09/09/23 2205

## 2023-09-09 NOTE — ED Notes (Addendum)
 ED provider notified of BP of 89/57. ED provider ordered a bolus of fluids.

## 2023-09-09 NOTE — ED Provider Notes (Signed)
 [facility] Provider Note    Event Date/Time   First MD Initiated Contact with Patient 09/09/23 0848     (approximate)   History   Seizures   HPI  Jennifer Woods is a 43 year old female with history of bipolar disorder, seizure disorder presenting with altered mental status.  Accompanied by boyfriend who provides collateral history.  He notes that about a week ago someone brought up the patient's dead son to her and he thinks this triggered her.  Since that time, he thinks that she has been using illicit drugs.  He notes that she does not take any medications regularly including seizure medications.  Patient also told him that she thinks she had a seizure yesterday.  Patient confused, tangential on my exam, but able to tell me that she was on the toilet yesterday and remembers waking up on the floor and having bit her tongue.  Unsure when her last seizure was.  Limited prior records in our system.  I do see a visit for seizure in the [facility] system from 2022.  At that time, patient was on Dilantin .Do also note that she saw her primary care doctor a few days ago related to abdominal pain.     Physical Exam   Triage Vital Signs: ED Triage Vitals  Encounter Vitals Group     BP 09/09/23 0853 (!) 102/59     Girls Systolic BP Percentile --      Girls Diastolic BP Percentile --      Boys Systolic BP Percentile --      Boys Diastolic BP Percentile --      Pulse Rate 09/09/23 0852 90     Resp 09/09/23 0852 18     Temp 09/09/23 0857 98.1 F (36.7 C)     Temp Source 09/09/23 0857 Oral     SpO2 09/09/23 0852 99 %     Weight 09/09/23 0851 150 lb (68 kg)     Height 09/09/23 0851 5' (1.524 m)     Head Circumference --      Peak Flow --      Pain Score 09/09/23 0851 0     Pain Loc --      Pain Education --      Exclude from Growth Chart --     Most recent vital signs: Vitals:   09/09/23 0857 09/09/23 0930  BP:  (!) 91/57  Pulse:  85  Resp:  (!) 21  Temp:  98.1 F (36.7 C)   SpO2:  100%     General: Awake, interactive  CV:  Regular rate, good peripheral perfusion.  Resp:  Unlabored respirations. Abd:  Nondistended.  Neuro:  No gross facial asymmetry, speech is fluid but tangential, frequently moving around in bed, 5/ 5 strength in the bilateral upper and lower extremities   ED Results / Procedures / Treatments   Labs (all labs ordered are listed, but only abnormal results are displayed) Labs Reviewed  CBC WITH DIFFERENTIAL/PLATELET - Abnormal; Notable for the following components:      Result Value   RBC 3.81 (*)    Hemoglobin 11.7 (*)    HCT 35.1 (*)    All other components within normal limits  COMPREHENSIVE METABOLIC PANEL WITH GFR - Abnormal; Notable for the following components:   Calcium 8.7 (*)    Total Protein 5.9 (*)    Albumin 3.3 (*)    All other components within normal limits  URINE DRUG SCREEN, QUALITATIVE [facility]  ONLY) - Abnormal; Notable for the following components:   Cocaine Metabolite,Ur [place] POSITIVE (*)    Cannabinoid 50 Ng, Ur [place] POSITIVE (*)    All other components within normal limits  ETHANOL  HCG, QUANTITATIVE, PREGNANCY  POC URINE PREG, ED     EKG EKG independently reviewed and interpreted by myself demonstrates:  EKG demonstrates sinus rhythm at a rate of 84, PR 146, QRS 80, QTc 525.  Interpreted by computer as acute MI, but no evidence of STEMI on my review, suspect related to artifact  RADIOLOGY Imaging independently reviewed and interpreted by myself demonstrates:  CT head without acute intracranial process.  Does note the middle ear opacification, patient without complaints of right ear pain.  Formal Radiology Read:  CT Head Wo Contrast Result Date: 09/09/2023 CLINICAL DATA:  43 year old female with unexplained altered mental status. History of seizures. EXAM: CT HEAD WITHOUT CONTRAST TECHNIQUE: Contiguous axial images were obtained from the base of the skull through the vertex without  intravenous contrast. RADIATION DOSE REDUCTION: This exam was performed according to the departmental dose-optimization program which includes automated exposure control, adjustment of the mA and/or kV according to patient size and/or use of iterative reconstruction technique. COMPARISON:  Head CT 06/05/2016. FINDINGS: Brain: Normal cerebral volume. No midline shift, ventriculomegaly, mass effect, evidence of mass lesion, intracranial hemorrhage or evidence of cortically based acute infarction. Gray-white matter differentiation is within normal limits throughout the brain. Partially empty sella is chronic, present as far back as 2006. Vascular: No suspicious intracranial vascular hyperdensity. Skull: Intact.  No acute osseous abnormality identified. Sinuses/Orbits: Chronic appearing right mastoid opacification. Right tympanic cavity opacification appears increased since 2018. Other Visualized paranasal sinuses and mastoids are stable and well aerated. Other: Visualized orbits and scalp soft tissues are within normal limits. IMPRESSION: 1. Stable and negative noncontrast CT appearance of the brain. 2. Right middle ear and mastoid opacification appears increased since 2018. Consider sequela of otitis media, cholesteatoma. Electronically Signed   By: VEAR Hurst M.D.   On: 09/09/2023 11:23    PROCEDURES:  Critical Care performed: No  Procedures   MEDICATIONS ORDERED IN ED: Medications - No data to display   IMPRESSION / MDM / ASSESSMENT AND PLAN / ED COURSE  I reviewed the triage vital signs and the nursing notes.  Differential diagnosis includes, but is not limited to, drug intoxication or withdrawal, substance-induced mood disorder, breakthrough seizure secondary to medication nonadherence, electrolyte abnormality, primary psychiatric disorder,   Patient's presentation is most consistent with acute presentation with potential threat to life or bodily function.  43 year old female presenting to the ER  for evaluation of altered mental status.  Clinical history concerning for substance-induced mood disorder, though consideration for decompensated primary psychiatric disorder.  Limited history due to patient's altered mental status, but does sound like she may have had a seizure event yesterday.  Will obtain labs, CT to further evaluate.  If these are unremarkable, suspect patient can be medically cleared for psychiatric evaluation.  Labs overall reassuring.  Mild anemia noted.  Electrolytes without significant derangement.  UDS positive for cocaine and THC.  Negative EtOH.  Lower suspicion primary medical etiology for patient's altered mental status.  Do think she can be medically cleared for psychiatric evaluation.  No indication for IVC currently.  Will consult psychiatry and TTS.  The patient has been placed in psychiatric observation due to the need to provide a safe environment for the patient while obtaining psychiatric consultation and evaluation, as well as ongoing  medical and medication management to treat the patient's condition.  The patient has not been placed under full IVC at this time.       FINAL CLINICAL IMPRESSION(S) / ED DIAGNOSES   Final diagnoses:  Abnormal behavior  Seizure-like activity (HCC)     Rx / DC Orders   ED Discharge Orders     None        Note:  This document was prepared using Dragon voice recognition software and may include unintentional dictation errors.   Levander Slate, MD 09/09/23 [phone]

## 2023-09-09 NOTE — BH Assessment (Signed)
 Comprehensive Clinical Assessment (CCA) Note  09/09/2023 Jennifer Woods Dollar [id number]  Chief Complaint:  Chief Complaint  Patient presents with   Seizures   Visit Diagnosis: Bipolar   Jennifer Woods. Falwell is a 43 year old female who presents to the ER via her boyfriend, after they was advised to do so by [facility]). Per the patient's boyfriend, approximately two weeks ago, her behaviors and mental state has changed. She's paranoid and hearing things. He also shared she was released from jail in February, and hasn't had any of her medications to treat her Bipolar. He also suspects she's abusing drugs as well. He further explained, approximately fifteen years ago, the patient's son passed away. His birthday was last week and this week is the anniversary of his death. He believes this has a great deal to do with her current presentation.  During the interview, the patient was unable to provide much information about what led to her coming to the ER. Majority of the information was provided by her boyfriend.  CCA Screening, Triage and Referral (STR)  Patient Reported Information How did you hear about us ? Family/Friend  What Is the Reason for Your Visit/Call Today? The patient's behaviors and mental state has changed. She's paranoid and hearing things.  How Long Has This Been Causing You Problems? 1 wk - 1 month  What Do You Feel Would Help You the Most Today? Treatment for Depression or other mood problem   Have You Recently Had Any Thoughts About Hurting Yourself? No  Are You Planning to Commit Suicide/Harm Yourself At This time? No   Flowsheet Row ED from 09/09/2023 in [facility] Emergency Department at [facility] ED from 09/03/2023 in [facility] Emergency Department at [facility] ED from 07/14/2023 in [facility] Emergency Department at [facility]  C-SSRS RISK CATEGORY No Risk No Risk No Risk    Have you Recently Had Thoughts About Hurting  Someone Sherral? No  Are You Planning to Harm Someone at This Time? No  Explanation: No data recorded  Have You Used Any Alcohol or Drugs in the Past 24 Hours? Yes  How Long Ago Did You Use Drugs or Alcohol? No data recorded What Did You Use and How Much? No data recorded  Do You Currently Have a Therapist/Psychiatrist? Yes  Name of Therapist/Psychiatrist: Name of Therapist/Psychiatrist: [facility]   Have You Been Recently Discharged From Any Office Practice or Programs? No  Explanation of Discharge From Practice/Program: No data recorded    CCA Screening Triage Referral Assessment Type of Contact: Face-to-Face  Telemedicine Service Delivery:   Is this Initial or Reassessment?   Date Telepsych consult ordered in [facility]:    Time Telepsych consult ordered in [facility]:    Location of Assessment: [facility] ED  Provider Location: [facility] ED   Collateral Involvement: Patient's boyfriend   Does Patient Have a Automotive engineer Guardian? No  Legal Guardian Contact Information: No data recorded Copy of Legal Guardianship Form: No data recorded Legal Guardian Notified of Arrival: No data recorded Legal Guardian Notified of Pending Discharge: No data recorded If Minor and Not Living with Parent(s), Who has Custody? No data recorded Is CPS involved or ever been involved? Never  Is APS involved or ever been involved? Never   Patient Determined To Be At Risk for Harm To Self or Others Based on Review of Patient Reported Information or Presenting Complaint? No  Method: No data recorded Availability of Means: No data recorded Intent: No data recorded Notification Required:  No data recorded Additional Information for Danger to Others Potential: No data recorded Additional Comments for Danger to Others Potential: No data recorded Are There Guns or Other Weapons in Your Home? No  Types of Guns/Weapons: No data recorded Are These Weapons Safely Secured?                            No data  recorded Who Could Verify You Are Able To Have These Secured: No data recorded Do You Have any Outstanding Charges, Pending Court Dates, Parole/Probation? No data recorded Contacted To Inform of Risk of Harm To Self or Others: No data recorded   Does Patient Present under Involuntary Commitment? No    [place] of Residence: [place]   Patient Currently Receiving the Following Services: Medication Management   Determination of Need: Emergent (2 hours)   Options For Referral: Inpatient Hospitalization; ED Visit     CCA Biopsychosocial Patient Reported Schizophrenia/Schizoaffective Diagnosis in Past: No   Strengths: Have a support system, stable housing and polite.   Mental Health Symptoms Depression:  Change in energy/activity; Difficulty Concentrating; Hopelessness   Duration of Depressive symptoms: Duration of Depressive Symptoms: Less than two weeks   Mania:  Change in energy/activity   Anxiety:   Difficulty concentrating; Restlessness; Sleep; Tension; Worrying   Psychosis:  Hallucinations   Duration of Psychotic symptoms: Duration of Psychotic Symptoms: Less than six months   Trauma:  Detachment from others   Obsessions:  N/A   Compulsions:  N/A   Inattention:  N/A   Hyperactivity/Impulsivity:  N/A   Oppositional/Defiant Behaviors:  N/A   Emotional Irregularity:  N/A   Other Mood/Personality Symptoms:  No data recorded   Mental Status Exam Appearance and self-care  Stature:  Average   Weight:  Average weight   Clothing:  No data recorded  Grooming:  Normal   Cosmetic use:  None   Posture/gait:  -- (UTA)   Motor activity:  -- (UTA)   Sensorium  Attention:  Confused; Unaware   Concentration:  -- (UTA)   Orientation:  -- (UTA)   Recall/memory:  -- (UTA)   Affect and Mood  Affect:  Inappropriate   Mood:  Depressed   Relating  Eye contact:  None   Facial expression:  Constricted   Attitude toward examiner:  -- (UTA)   Thought  and Language  Speech flow: Garbled   Thought content:  Delusions   Preoccupation:  Ruminations   Hallucinations:  Auditory   Organization:  Other (Comment)   Company secretary of Knowledge:  Poor   Intelligence:  Average   Abstraction:  Abstract   Judgement:  Impaired   Reality Testing:  Distorted   Insight:  Fair; Poor   Decision Making:  No data recorded  Social Functioning  Social Maturity:  Isolates; Impulsive   Social Judgement:  Chief of Staff; Naive; Heedless   Stress  Stressors:  Transitions; Relationship; Grief/losses   Coping Ability:  Deficient supports   Skill Deficits:  None   Supports:  Support needed     Religion: Religion/Spirituality Are You A Religious Person?: No  Leisure/Recreation: Leisure / Recreation Do You Have Hobbies?: No  Exercise/Diet: Exercise/Diet Do You Exercise?: No Have You Gained or Lost A Significant Amount of Weight in the Past Six Months?: No Do You Follow a Special Diet?: No Do You Have Any Trouble Sleeping?: No   CCA Employment/Education Employment/Work Situation: Employment / Work Situation Employment Situation: Unemployed  Patient's Job has Been Impacted by Current Illness: No Has Patient ever Been in the [employer]?: No  Education: Education Is Patient Currently Attending School?: No Did You Product manager?: No Did You Have An Individualized Education Program (IIEP): No Did You Have Any Difficulty At School?: No Patient's Education Has Been Impacted by Current Illness: No   CCA Family/Childhood History Family and Relationship History: Family history Marital status: Long term relationship Does patient have children?: Yes How is patient's relationship with their children?: He deceased  Childhood History:  Childhood History By whom was/is the patient raised?: Mother Did patient suffer any verbal/emotional/physical/sexual abuse as a child?: No Did patient suffer from severe childhood  neglect?: No Has patient ever been sexually abused/assaulted/raped as an adolescent or adult?: No Was the patient ever a victim of a crime or a disaster?: No Witnessed domestic violence?: No Has patient been affected by domestic violence as an adult?: No   CCA Substance Use Alcohol/Drug Use: Alcohol / Drug Use Pain Medications: See MAR Prescriptions: See MAR Over the Counter: See MAR History of alcohol / drug use?: Yes Longest period of sobriety (when/how long): Unable to quantify Substance #1 Name of Substance 1: Cocaine Substance #2 Name of Substance 2: Alcohol   ASAM's:  Six Dimensions of Multidimensional Assessment  Dimension 1:  Acute Intoxication and/or Withdrawal Potential:      Dimension 2:  Biomedical Conditions and Complications:      Dimension 3:  Emotional, Behavioral, or Cognitive Conditions and Complications:     Dimension 4:  Readiness to Change:     Dimension 5:  Relapse, Continued use, or Continued Problem Potential:     Dimension 6:  Recovery/Living Environment:     ASAM Severity Score:    ASAM Recommended Level of Treatment:     Substance use Disorder (SUD)    Recommendations for Services/Supports/Treatments:    Disposition Recommendation per psychiatric provider: Inpatient Treatment  DSM5 Diagnoses: Patient Active Problem List   Diagnosis Date Noted   Bipolar 1 disorder, depressed, moderate (HCC) 08/12/2016   Sedative, hypnotic or anxiolytic use disorder, severe, dependence (HCC) 06/07/2016   Cannabis use disorder, moderate, dependence (HCC) 06/07/2016   Bipolar 1 disorder, mixed, severe (HCC) 06/06/2016   Tobacco use disorder 06/06/2016   Seizures (HCC) 06/05/2016   Cocaine abuse (HCC) 06/05/2016   Noncompliance 06/05/2016    Referrals to Alternative Service(s): Referred to Alternative Service(s):   Place:   Date:   Time:    Referred to Alternative Service(s):   Place:   Date:   Time:    Referred to Alternative Service(s):   Place:    Date:   Time:    Referred to Alternative Service(s):   Place:   Date:   Time:     Kiki DOROTHA Barge MS, LCAS, [facility], [facility] Therapeutic Triage Specialist 09/09/2023 4:49 PM

## 2023-09-10 LAB — URINALYSIS, ROUTINE W REFLEX MICROSCOPIC
Bacteria, UA: NONE SEEN
Bilirubin Urine: NEGATIVE
Glucose, UA: NEGATIVE mg/dL
Ketones, ur: NEGATIVE mg/dL
Leukocytes,Ua: NEGATIVE
Nitrite: NEGATIVE
Protein, ur: NEGATIVE mg/dL
Specific Gravity, Urine: 1.027 (ref 1.005–1.030)
pH: 6 (ref 5.0–8.0)

## 2023-09-10 LAB — PREGNANCY, URINE: Preg Test, Ur: NEGATIVE

## 2023-09-10 LAB — LACTIC ACID, PLASMA: Lactic Acid, Venous: 0.7 mmol/L (ref 0.5–1.9)

## 2023-09-10 MED ORDER — GABAPENTIN 300 MG PO CAPS: 400.0000 mg | ORAL_CAPSULE | Freq: Three times a day (TID) | ORAL | Status: DC

## 2023-09-10 MED ORDER — GABAPENTIN 300 MG PO CAPS
300.0000 mg | ORAL_CAPSULE | Freq: Three times a day (TID) | ORAL | Status: DC
  Administered 2023-09-10 – 2023-09-11 (×5): 300 mg via ORAL
  Filled 2023-09-10 (×5): qty 1

## 2023-09-10 NOTE — ED Notes (Signed)
 Pt given ice water per request, sitting on side of bed, and BP being taken.

## 2023-09-10 NOTE — ED Notes (Signed)
 Pt provided with snacks. Pt verbalized frustration with being IVC'd. This RN explained the reasoning behind her stay. Family at bedside

## 2023-09-10 NOTE — ED Notes (Signed)
 Pt given sandwich tray, extra graham crackers, and peanut butter.

## 2023-09-10 NOTE — ED Provider Notes (Signed)
 Normal lactic Urinalysis without evidence of infection  HGC < 1  BPs  Vitals:   09/09/23 2200 09/10/23 0012  BP: (!) 80/50 (!) 85/50  Pulse:    Resp:    Temp:    SpO2:     ----------------------------------------- 12:49 AM on 09/10/2023 ----------------------------------------- It is noted that psychiatry yesterday made recommendation that the patient be placed under involuntary commitment.  Does not appear that this was completed as of this time, but the patient is currently making statements of potentially requesting to leave.  Given the recommendation from a psychiatry attending to have the patient placed under involuntary commitment and the consideration for psychiatric hospitalization after medical clearance, I will place her under IVC for safety.  Also start her on gabapentin  Patient is awake alert oriented able to sit up at this time.  She does not show any distress or evidence of deterioration.  Awaiting lactic acid urinalysis  Involuntary commitment process initiated by me after reviewing psychiatrist note recommending the patient be placed under IVC.   Dicky Anes, MD 09/10/23 [phone]

## 2023-09-10 NOTE — ED Notes (Signed)
 Dinner tray given to pt

## 2023-09-10 NOTE — ED Notes (Signed)
Snacks given to pt.

## 2023-09-10 NOTE — ED Provider Notes (Signed)
 Patient alerts to voice.  Able to sit up to drink water.  Blood pressure 85 systolic.  Have been monitoring hypotension.  Some question as to whether this may be associated with cocaine washout.  Her mental status seems to be well, requesting something to drink sitting up.  Continue to monitor hypotension.  She is awake alert mentating and has no evidence of support systemic dysfunction.  Will send a lactic acid and check urinalysis as well for further.  Anticipate further observation in the ER but if change or deterioration in mental status or further deterioration in blood pressure occurs would consider admission to the hospitalist service for further workup but at this point no clear obvious acute medical causation that would require hospitalization is yet identified   Dicky Anes, MD 09/10/23 0013

## 2023-09-10 NOTE — ED Provider Notes (Addendum)
 Emergency Medicine Observation Re-evaluation Note  Jennifer Woods is a 43 y.o. female, seen on rounds today.  Pt initially presented to the ED for complaints of Seizures  Currently, the patient is is no acute distress. Denies any concerns at this time.  Physical Exam  Blood pressure 95/70, pulse (!) 58, temperature 97.7 F (36.5 C), temperature source Oral, resp. rate 16, height 5' (1.524 m), weight 68 kg, last menstrual period 08/29/2023, SpO2 95%.  Physical Exam: General: No apparent distress Pulm: Normal WOB Neuro: Moving all extremities Psych: Resting comfortably     ED Course / MDM   Clinical Course as of 09/10/23 0751  Fri Sep 10, 2023  0708 Presented with ams - recently relapsed? UDS + cocaine. Persistent hypotension - given 3L total of IVF. Tolerating PO. BP uptrending, needing repeat before medically cleared. No obvious source of infection.   [SM]    Clinical Course User Index [SM] Suzanne Kirsch, MD    I have reviewed the labs performed to date as well as medications administered while in observation.  Recent changes in the last 24 hours include: Soft blood pressures, plan to reevaluate this morning.  If continues to be hypotensive will consider medical admission.  No obvious source of an infectious process with normal lactic acid.  Tolerated p.o.  9:41 AM Patient medically cleared, blood pressure stable today 113/85.  Plan   Current plan: Patient awaiting psychiatric disposition. Patient is under full IVC at this time.    Suzanne Kirsch, MD 09/10/23 9246    Suzanne Kirsch, MD 09/10/23 [phone]

## 2023-09-10 NOTE — ED Notes (Signed)
 IVC/pending psych consult

## 2023-09-10 NOTE — ED Notes (Signed)
 Pt requesting more to eat.  Pt also wanting her book and belongings, informed of belongings policy and now getting upset and wanting to leave.  MD notified.

## 2023-09-10 NOTE — ED Notes (Signed)
Ivc  

## 2023-09-11 ENCOUNTER — Inpatient Hospital Stay
Admission: AD | Admit: 2023-09-11 | Discharge: 2023-09-15 | DRG: 885 | Disposition: A | Discharge status: Home / Self Care | Payer: MEDICAID | Source: Intra-hospital | Attending: Psychiatry | Admitting: Psychiatry

## 2023-09-11 ENCOUNTER — Encounter: Payer: Self-pay | Admitting: Child & Adolescent Psychiatry

## 2023-09-11 ENCOUNTER — Other Ambulatory Visit: Payer: Self-pay

## 2023-09-11 DIAGNOSIS — R4182 Altered mental status, unspecified: Secondary | ICD-10-CM | POA: Diagnosis present

## 2023-09-11 DIAGNOSIS — Z91148 Patient's other noncompliance with medication regimen for other reason: Secondary | ICD-10-CM

## 2023-09-11 DIAGNOSIS — R079 Chest pain, unspecified: Secondary | ICD-10-CM | POA: Diagnosis not present

## 2023-09-11 DIAGNOSIS — G40909 Epilepsy, unspecified, not intractable, without status epilepticus: Secondary | ICD-10-CM | POA: Diagnosis present

## 2023-09-11 DIAGNOSIS — Z9152 Personal history of nonsuicidal self-harm: Secondary | ICD-10-CM

## 2023-09-11 DIAGNOSIS — R569 Unspecified convulsions: Secondary | ICD-10-CM | POA: Diagnosis not present

## 2023-09-11 DIAGNOSIS — F3163 Bipolar disorder, current episode mixed, severe, without psychotic features: Secondary | ICD-10-CM | POA: Diagnosis present

## 2023-09-11 DIAGNOSIS — F410 Panic disorder [episodic paroxysmal anxiety] without agoraphobia: Secondary | ICD-10-CM | POA: Diagnosis present

## 2023-09-11 DIAGNOSIS — F840 Autistic disorder: Secondary | ICD-10-CM | POA: Diagnosis present

## 2023-09-11 DIAGNOSIS — F1190 Opioid use, unspecified, uncomplicated: Secondary | ICD-10-CM | POA: Diagnosis present

## 2023-09-11 DIAGNOSIS — Z5982 Transportation insecurity: Secondary | ICD-10-CM

## 2023-09-11 DIAGNOSIS — F17210 Nicotine dependence, cigarettes, uncomplicated: Secondary | ICD-10-CM | POA: Diagnosis present

## 2023-09-11 DIAGNOSIS — F4310 Post-traumatic stress disorder, unspecified: Secondary | ICD-10-CM | POA: Diagnosis present

## 2023-09-11 DIAGNOSIS — Z5941 Food insecurity: Secondary | ICD-10-CM | POA: Diagnosis not present

## 2023-09-11 DIAGNOSIS — F1910 Other psychoactive substance abuse, uncomplicated: Secondary | ICD-10-CM | POA: Diagnosis not present

## 2023-09-11 DIAGNOSIS — F1290 Cannabis use, unspecified, uncomplicated: Secondary | ICD-10-CM | POA: Diagnosis present

## 2023-09-11 DIAGNOSIS — R001 Bradycardia, unspecified: Secondary | ICD-10-CM | POA: Diagnosis not present

## 2023-09-11 DIAGNOSIS — F419 Anxiety disorder, unspecified: Secondary | ICD-10-CM | POA: Diagnosis present

## 2023-09-11 DIAGNOSIS — Z5986 Financial insecurity: Secondary | ICD-10-CM | POA: Diagnosis not present

## 2023-09-11 DIAGNOSIS — G4700 Insomnia, unspecified: Secondary | ICD-10-CM | POA: Diagnosis present

## 2023-09-11 DIAGNOSIS — F1490 Cocaine use, unspecified, uncomplicated: Secondary | ICD-10-CM | POA: Diagnosis present

## 2023-09-11 LAB — CBG MONITORING, ED: Glucose-Capillary: 81 mg/dL (ref 70–99)

## 2023-09-11 MED ORDER — PHENYTOIN SODIUM EXTENDED 100 MG PO CAPS: 300.0000 mg | ORAL_CAPSULE | Freq: Every day | ORAL | Status: DC

## 2023-09-11 MED ORDER — PNEUMOCOCCAL 20-VAL CONJ VACC 0.5 ML IM SUSY
0.5000 mL | PREFILLED_SYRINGE | INTRAMUSCULAR | Status: DC
  Filled 2023-09-11: qty 0.5

## 2023-09-11 MED ORDER — ACETAMINOPHEN 325 MG PO TABS
650.0000 mg | ORAL_TABLET | Freq: Four times a day (QID) | ORAL | Status: DC | PRN
  Administered 2023-09-12 – 2023-09-15 (×6): 650 mg via ORAL
  Filled 2023-09-11 (×6): qty 2

## 2023-09-11 MED ORDER — BENZTROPINE MESYLATE 1 MG PO TABS
0.5000 mg | ORAL_TABLET | Freq: Two times a day (BID) | ORAL | Status: DC
  Administered 2023-09-12: 0.5 mg via ORAL
  Filled 2023-09-11: qty 1

## 2023-09-11 MED ORDER — MAGNESIUM HYDROXIDE 400 MG/5ML PO SUSP: 30.0000 mL | Freq: Every day | ORAL | Status: DC | PRN

## 2023-09-11 MED ORDER — BENZTROPINE MESYLATE 1 MG PO TABS: 0.5000 mg | ORAL_TABLET | Freq: Two times a day (BID) | ORAL | Status: DC

## 2023-09-11 MED ORDER — ARIPIPRAZOLE 10 MG PO TABS
10.0000 mg | ORAL_TABLET | Freq: Every day | ORAL | Status: DC
  Administered 2023-09-12 – 2023-09-15 (×4): 10 mg via ORAL
  Filled 2023-09-11 (×4): qty 1

## 2023-09-11 MED ORDER — PHENYTOIN SODIUM EXTENDED 100 MG PO CAPS
300.0000 mg | ORAL_CAPSULE | Freq: Every day | ORAL | Status: DC
  Filled 2023-09-11: qty 3

## 2023-09-11 MED ORDER — NICOTINE POLACRILEX 2 MG MT GUM
2.0000 mg | CHEWING_GUM | OROMUCOSAL | Status: DC | PRN
  Administered 2023-09-12 – 2023-09-14 (×5): 2 mg via ORAL
  Filled 2023-09-11 (×7): qty 1

## 2023-09-11 MED ORDER — NICOTINE 14 MG/24HR TD PT24
14.0000 mg | MEDICATED_PATCH | Freq: Every day | TRANSDERMAL | Status: DC
  Administered 2023-09-12 – 2023-09-15 (×4): 14 mg via TRANSDERMAL
  Filled 2023-09-11 (×5): qty 1

## 2023-09-11 MED ORDER — ARIPIPRAZOLE 10 MG PO TABS: 10.0000 mg | ORAL_TABLET | Freq: Every day | ORAL | Status: DC

## 2023-09-11 MED ORDER — GABAPENTIN 300 MG PO CAPS
300.0000 mg | ORAL_CAPSULE | Freq: Three times a day (TID) | ORAL | Status: DC
  Administered 2023-09-12: 300 mg via ORAL
  Filled 2023-09-11: qty 1

## 2023-09-11 MED ORDER — ALUM & MAG HYDROXIDE-SIMETH 200-200-20 MG/5ML PO SUSP: 30.0000 mL | ORAL | Status: DC | PRN

## 2023-09-11 NOTE — ED Notes (Signed)
 Visitor in dayroom with pt and patient relations.

## 2023-09-11 NOTE — ED Notes (Signed)
 Pt given lunch at bedside

## 2023-09-11 NOTE — ED Notes (Signed)
 Pt given breakfast tray

## 2023-09-11 NOTE — Progress Notes (Signed)
 Patient ID: Jennifer Woods, female   DOB: Feb 17, 1980, 43 y.o.   MRN: [id number]  Patient admitted via IVC from [facility] ED. Patient presented to the ED for seizure like activity and was IVC'd for chronic substance abuse and erratic behaviors. Patient reports using cocaine and pain pills. Denies ETOH. Patient reports history of epilepsy since she was 43 years old. Denies SI/HI/VH. Endorses AH last heard a couple of days ago and hears voices saying just stuff. Reports being in prison for 9 months and was release December 2024. States that her triggers are insomnia, increased stress, and family conflict. States that her goal is detox. Patient oriented to unit rules and procedures. Safety checks initiated. Patient remains safe at this time.

## 2023-09-11 NOTE — Plan of Care (Signed)
   Problem: Education: Goal: Emotional status will improve Outcome: Not Progressing Goal: Mental status will improve Outcome: Not Progressing Goal: Verbalization of understanding the information provided will improve Outcome: Not Progressing

## 2023-09-11 NOTE — ED Notes (Signed)
 Pt given Snack

## 2023-09-11 NOTE — ED Provider Notes (Signed)
 Emergency Medicine Observation Re-evaluation Note  Jennifer Woods is a 43 y.o. female, seen on rounds today.  Pt initially presented to the ED for complaints of Seizures  Currently, the patient is resting comfortably.  Physical Exam  BP (!) 101/59 (BP Location: Right Arm)   Pulse 61   Temp 98 F (36.7 C) (Oral)   Resp 17   Ht 5' (1.524 m)   Wt 68 kg   LMP 08/29/2023   SpO2 96%   BMI 29.29 kg/m  General: No acute distress Cardiac: Well-perfused extremities Lungs: No respiratory distress Psych: Appropriate mood and affect  ED Course / MDM  EKG:EKG Interpretation Date/Time:  Thursday September 09 2023 08:51:55 EDT Ventricular Rate:  84 PR Interval:  146 QRS Duration:  80 QT Interval:  444 QTC Calculation: 525 R Axis:   68  Text Interpretation: Sinus rhythm Lateral infarct, acute Prolonged QT interval >>> Acute MI <<< Confirmed by UNCONFIRMED, DOCTOR ([id number]), editor Alex Slough ([id number]) on 09/09/2023 11:38:38 AM  I have reviewed the labs performed to date as well as medications administered while in observation.  Recent changes in the last 24 hours include none.  Plan  Current plan is for placement.   Delta Pichon K, MD 09/11/23 [phone]

## 2023-09-11 NOTE — Tx Team (Signed)
 Initial Treatment Plan 09/11/2023 5:46 PM Jennifer Woods FMW:[id number]    PATIENT STRESSORS: Substance abuse     PATIENT STRENGTHS: Supportive family/friends    PATIENT IDENTIFIED PROBLEMS: Substance abuse                     DISCHARGE CRITERIA:  Ability to meet basic life and health needs Verbal commitment to aftercare and medication compliance  PRELIMINARY DISCHARGE PLAN: Attend 12-step recovery group Outpatient therapy  PATIENT/FAMILY INVOLVEMENT: This treatment plan has been presented to and reviewed with the patient, Jennifer Woods.  The patient has been given the opportunity to ask questions and make suggestions.  Eleanor JAYSON Metro, RN 09/11/2023, 5:46 PM

## 2023-09-12 DIAGNOSIS — F1910 Other psychoactive substance abuse, uncomplicated: Secondary | ICD-10-CM | POA: Diagnosis not present

## 2023-09-12 DIAGNOSIS — F3163 Bipolar disorder, current episode mixed, severe, without psychotic features: Secondary | ICD-10-CM | POA: Diagnosis not present

## 2023-09-12 LAB — TSH: TSH: 1.841 u[IU]/mL (ref 0.350–4.500)

## 2023-09-12 LAB — LIPID PANEL
Cholesterol: 158 mg/dL (ref 0–200)
HDL: 55 mg/dL (ref >40)
LDL Cholesterol: 90 mg/dL (ref 0–99)
Total CHOL/HDL Ratio: 2.9 ratio
Triglycerides: 66 mg/dL (ref <150)
VLDL: 13 mg/dL (ref 0–40)

## 2023-09-12 LAB — VITAMIN D 25 HYDROXY (VIT D DEFICIENCY, FRACTURES): Vit D, 25-Hydroxy: 36.82 ng/mL (ref 30–100)

## 2023-09-12 MED ORDER — GABAPENTIN 300 MG PO CAPS
600.0000 mg | ORAL_CAPSULE | Freq: Three times a day (TID) | ORAL | Status: DC
  Administered 2023-09-12 – 2023-09-15 (×9): 600 mg via ORAL
  Filled 2023-09-12 (×9): qty 2

## 2023-09-12 NOTE — Group Note (Signed)
 Date:  09/12/2023 Time:  6:42 PM  Group Topic/Focus:  Activity Group: The focus of the group is to promote activity for the patients and to encourage them to go outside to the courtyard and get some fresh air and exercise.    Participation Level:  Did Not Attend   Jennifer Woods 09/12/2023, 6:42 PM

## 2023-09-12 NOTE — Plan of Care (Signed)
   Problem: Education: Goal: Emotional status will improve Outcome: Progressing Goal: Mental status will improve Outcome: Progressing Goal: Verbalization of understanding the information provided will improve Outcome: Progressing

## 2023-09-12 NOTE — Group Note (Signed)
 Date:  09/12/2023 Time:  10:14 PM  Group Topic/Focus:  Wrap-Up Group:   The focus of this group is to help patients review their daily goal of treatment and discuss progress on daily workbooks.    Participation Level:  Did Not Attend  Participation Quality:  none  Affect:  none  Cognitive:  none  Insight: None  Engagement in Group:  none  Modes of Intervention:  none  Additional Comments:  none   Kerri Katz 09/12/2023, 10:14 PM

## 2023-09-12 NOTE — Group Note (Signed)
 [facility] LCSW Group Therapy Note   Group Date: 09/12/2023 Start Time: 1400 End Time: 1445   Type of Therapy/Topic:  Group Therapy:  Emotion Regulation  Participation Level:  Did Not Attend   Mood:  Description of Group:    The purpose of this group is to assist patients in learning to regulate negative emotions and experience positive emotions. Patients will be guided to discuss ways in which they have been vulnerable to their negative emotions. These vulnerabilities will be juxtaposed with experiences of positive emotions or situations, and patients challenged to use positive emotions to combat negative ones. Special emphasis will be placed on coping with negative emotions in conflict situations, and patients will process healthy conflict resolution skills.  Therapeutic Goals: Patient will identify two positive emotions or experiences to reflect on in order to balance out negative emotions:  Patient will label two or more emotions that they find the most difficult to experience:  Patient will be able to demonstrate positive conflict resolution skills through discussion or role plays:   Summary of Patient Progress:  The patient did not attend group.      Therapeutic Modalities:   Cognitive Behavioral Therapy Feelings Identification Dialectical Behavioral Therapy   Roselyn GORMAN Lento, LCSW
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
## 2023-09-12 NOTE — Group Note (Signed)
 Date:  09/12/2023 Time:  2:24 PM  Group Topic/Focus:  Identifying Needs:   The focus of this group is to help patients identify their personal needs that have been historically problematic and identify healthy behaviors to address their needs.    Participation Level:  Did Not Attend   Jennifer Woods [name] 09/12/2023, 2:24 PM

## 2023-09-12 NOTE — Plan of Care (Signed)
  Problem: Education: Goal: Emotional status will improve Outcome: Progressing Goal: Mental status will improve Outcome: Progressing   Problem: Coping: Goal: Ability to verbalize frustrations and anger appropriately will improve Outcome: Progressing Goal: Ability to demonstrate self-control will improve Outcome: Progressing   Problem: Safety: Goal: Periods of time without injury will increase Outcome: Progressing   Problem: Education: Goal: Knowledge of  General Education information/materials will improve Outcome: Progressing

## 2023-09-12 NOTE — BHH Counselor (Signed)
 The LCSWA attempted to complete the PSA with the patient. The patient was sleep and did not respond to name being called or knock on the door. CSW will attempt PSA at a later time.  Jazzmon Prindle, MSW, LCSWA  09/12/23 at 3:15 pm

## 2023-09-12 NOTE — Progress Notes (Signed)
   09/12/23 0555  Psych Admission Type (Psych Patients Only)  Admission Status Involuntary  Psychosocial Assessment  Patient Complaints Sadness  Eye Contact Avertive;Brief  Facial Expression Anxious  Affect Angry  Speech Logical/coherent  Interaction Assertive;Attention-seeking  Motor Activity Restless  Appearance/Hygiene Improved  Behavior Characteristics Cooperative;Appropriate to situation  Mood Apprehensive  Thought Process  Coherency WDL  Content WDL  Delusions WDL  Perception WDL  Hallucination None reported or observed  Judgment WDL  Confusion WDL   No distress noted, interacting appropriately with peers, thoughts are organized, mood is congruent with affect, denies SI/HI/AVH, patient went to be bed early and did not take her seizure medication. 15 minutes safety checks m,maintained.

## 2023-09-12 NOTE — H&P (Signed)
 Psychiatric Admission Assessment Adult  Patient Identification: Jennifer Woods MRN:  [id number] Date of Evaluation:  09/12/2023 Chief Complaint:  Substance abuse (HCC) [F19.10]   History of Present Illness: Patient is a 43 year old female with a history of epilepsy, autistic disorder as listed in chart, bipolar I disorder, insomnia, anxiety, and ptsd.  Who initially presented to the ED following seizure.  Collateral from significant other indicated that it was the anniversary of her son's death and that she had recently relapsed on drugs following this for the past 8 days she had been using cocaine and not sleeping.  There is also some report of Suboxone use.  Patient indicates they have been off medications for seizures and bipolar disorders since at least last year.  She notes she was previously on Dilantin .  Attending psychiatrist Dr. Tena room and recommended gabapentin  600 mg 3-4 times daily, on ED note.  EKG in ED indicated acute MI which the emergency department ruled out.  QTc was prolonged at greater than 500.  Patient was started on Abilify , Cogentin , gabapentin .   On interview patient is alert and oriented.  She reports that the last time she received mental health care was approximately 9 months ago.  She indicates she is doing well today stating that she has been sleeping the drugs off for approximately 3 days now.  She endorses cocaine and prescription pill use.  She reports when she was receiving care previously she was in prison since getting out of prison she has not been taking any medications.  She also reports regular marijuana use.  She endorses a story that it was the recent anniversary of her son's death and she was using substances to cope.  She endorses recent depressive symptoms of low mood some feelings of hopeless ness and helplessness. Notes depression feeling better since getting some sleep .  At present she denies SI, HI, and AVH.  She is linear and logical on exam.   Reports she has had extensive medication trials and wants minimal medications.  Reports she is happy with current regiment.  We discussed adding medication for depressive symptoms however limited by prolonged QTc and patient's desire to minimize medications.  Total Time spent with patient: 1 hour Sleep  Sleep:Sleep: Good  Past Psychiatric History:   Psychiatric History:  Information collected from patient and chart review  Prev Dx/Sx: Bipolar disorder, polysubstance use, insomnia, anxiety, and PTSD Current Psych Provider: None Home Meds (current): None Previous Med Trials: risperdal, paxil, vraylar (made manic), states have been on a bunch Therapy: None  Prior Psych Hospitalization: 2016/09/25 most recent reports several since death of son in 09-26-02 Prior Self Harm: 2 2003/09/26 and 25-Sep-2005 Via OD Prior Violence: denies Went to prison states through some drugs out the window because evading police was lower   Family Psych History: mother bipolar and addiction Family Hx suicide: no  Social History:  Developmental Hx: normal Educational Hx: 2 years of college 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 No.  Has the patient been a risk to self in the past 6 months? No.  Has the patient been  a risk to self within the distant past? Yes.    Is the patient a risk to others? No.  Has the patient been a risk to others in the past 6 months? No.  Has the patient been a risk to others within the distant past? No.   [place] Scale:  Flowsheet Row Admission (Current) from 09/11/2023 in [facility] INPATIENT BEHAVIORAL  MEDICINE ED from 09/09/2023 in [facility] Emergency Department at [facility] ED from 09/03/2023 in [facility] Emergency Department at [facility]  C-SSRS RISK CATEGORY Error: Question 6 not populated No Risk No Risk     Past Medical History:  Past Medical History:  Diagnosis Date   Autistic disorder    Bipolar 1 disorder (HCC)    Seizures (HCC)    History reviewed. No pertinent surgical history. Family History: History reviewed. No pertinent family history.  Social History:  Social History   Substance and Sexual Activity  Alcohol Use No     Social History   Substance and Sexual Activity  Drug Use Yes   Types: Marijuana, Cocaine, Oxycodone      Allergies:  No Known Allergies Lab Results:  Results for orders placed or performed during the hospital encounter of 09/11/23 (from the past 48 hours)  Lipid panel     Status: None   Collection Time: 09/12/23 12:44 PM  Result Value Ref Range   Cholesterol 158 0 - 200 mg/dL   Triglycerides 66 <849 mg/dL   HDL 55 >59 mg/dL   Total CHOL/HDL Ratio 2.9 RATIO   VLDL 13 0 - 40 mg/dL   LDL Cholesterol 90 0 - 99 mg/dL    Comment:        Total Cholesterol/HDL:CHD Risk Coronary Heart Disease Risk Table                     Men   Women  1/2 Average Risk   3.4   3.3  Average Risk       5.0   4.4  2 X Average Risk   9.6   7.1  3 X Average Risk  23.4   11.0        Use the calculated Patient Ratio above and the CHD Risk Table to determine the patient's CHD Risk.        ATP III CLASSIFICATION (LDL):  <100     mg/dL   Optimal  899-870  mg/dL   Near or Above                    Optimal  130-159  mg/dL   Borderline  839-810  mg/dL   High  >809     mg/dL   Very High Performed at [facility], [address] [place], [place] [address]   TSH     Status: None   Collection Time: 09/12/23 12:44 PM  Result Value Ref Range   TSH 1.841 0.350 - 4.500 uIU/mL    Comment: Performed by a 3rd Generation assay with a  functional sensitivity of <=0.01 uIU/mL. Performed at [facility], [address] [place], [place] [address]     Blood Alcohol level:  Lab Results  Component Value Date   [facility] <15 09/09/2023   ETH <5 08/12/2016    Metabolic Disorder Labs:  Lab Results  Component Value Date   HGBA1C 5.0 06/07/2016   MPG 97 06/07/2016   No results found for: PROLACTIN Lab Results  Component Value Date  CHOL 158 09/12/2023   TRIG 66 09/12/2023   HDL 55 09/12/2023   CHOLHDL 2.9 09/12/2023   VLDL 13 09/12/2023   LDLCALC 90 09/12/2023   LDLCALC 61 06/07/2016    Current Medications: Current Facility-Administered Medications  Medication Dose Route Frequency Provider Last Rate Last Admin   acetaminophen  (TYLENOL ) tablet 650 mg  650 mg Oral Q6H PRN Hampton, Tracie B, NP   650 mg at 09/12/23 0845   alum & mag hydroxide-simeth (MAALOX/MYLANTA) 200-200-20 MG/5ML suspension 30 mL  30 mL Oral Q4H PRN Hampton, Tracie B, NP       ARIPiprazole  (ABILIFY ) tablet 10 mg  10 mg Oral Daily Hampton, Tracie B, NP   10 mg at 09/12/23 0844   gabapentin  (NEURONTIN ) capsule 600 mg  600 mg Oral TID Trinidee Schrag E, PA-C   600 mg at 09/12/23 1218   magnesium  hydroxide (MILK OF MAGNESIA) suspension 30 mL  30 mL Oral Daily PRN Hampton, Tracie B, NP       nicotine  (NICODERM CQ  - dosed in mg/24 hours) patch 14 mg  14 mg Transdermal Daily Lavine Hargrove E, PA-C   14 mg at 09/12/23 9155   nicotine  polacrilex (NICORETTE ) gum 2 mg  2 mg Oral PRN Windel Keziah E, PA-C       pneumococcal 20-valent conjugate vaccine (PREVNAR 20 ) injection 0.5 mL  0.5 mL Intramuscular Tomorrow-1000 Debbie Donnice BRAVO, PA-C       PTA Medications: No medications prior to admission.    Psychiatric Specialty Exam:  Presentation  General Appearance: Casual  Eye Contact:Fair  Speech:Clear and Coherent  Speech Volume:Normal    Mood and Affect  Mood:Euthymic  Affect:Congruent   Thought Process  Thought  Processes:Coherent  Descriptions of Associations:Intact  Orientation:Full (Time, Place and Person)  Thought Content:Logical  Hallucinations:Hallucinations: None  Ideas of Reference:None  Suicidal Thoughts:Suicidal Thoughts: No  Homicidal Thoughts:Homicidal Thoughts: No   Sensorium  Memory:Immediate Fair; Recent Fair  Judgment:Fair  Insight:Fair   Executive Functions  Concentration:Fair  Attention Span:Fair  Recall:Fair  Fund of Knowledge:Fair  Language:Fair   Psychomotor Activity  Psychomotor Activity:Psychomotor Activity: Normal   Assets  Assets:Communication Skills; Desire for Improvement    Musculoskeletal: Strength & Muscle Tone: within normal limits Gait & Station: normal  Physical Exam: Physical Exam Vitals and nursing note reviewed.  HENT:     Head: Atraumatic.  Eyes:     Extraocular Movements: Extraocular movements intact.  Pulmonary:     Effort: Pulmonary effort is normal.  Neurological:     Mental Status: She is alert and oriented to person, place, and time.    Review of Systems  Psychiatric/Behavioral:  Positive for depression and substance abuse. Negative for hallucinations and suicidal ideas. The patient is nervous/anxious and has insomnia.    Blood pressure 121/77, pulse (!) 59, temperature 97.9 F (36.6 C), resp. rate 18, height 5' (1.524 m), weight 68 kg, last menstrual period 08/29/2023, SpO2 100%. Body mass index is 29.28 kg/m.  Principal Diagnosis: Bipolar 1 disorder, mixed, severe (HCC) Diagnosis:  Principal Problem:   Bipolar 1 disorder, mixed, severe (HCC) Active Problems:   Substance abuse (HCC)   Clinical Decision Making:  Patient initially presented with altered mental status secondary to seizures secondary to known epilepsy and substance use in the context of medication noncompliance.  Patient with ongoing depression and PTSD.  Patient with recent exacerbation of depression symptoms following anniversary of son's  death.  Patient denying SI HI and AVH today.  They are agreeable with restarting  medications but wished to minimize medications.  Patient requires inpatient psychiatric hospitalization for medication management and stabilization given impulsivity likely secondary to substance use and least recent lack of sleep. Treatment Plan Summary:  Safety and Monitoring:             -- Voluntary admission to inpatient psychiatric unit for safety, stabilization and treatment             -- Daily contact with patient to assess and evaluate symptoms and progress in treatment             -- Patient's case to be discussed in multi-disciplinary team meeting             -- Observation Level: q15 minute checks             -- Vital signs:  q12 hours             -- Precautions: suicide, elopement, and assault   2. Psychiatric Diagnoses and Treatment:              Bipolar 1 disorder  Continue Abilify  10 mg daily  Patient wishes to minimize medications discussed medications for anxiety and depression.  Please revisit.     -- The risks/benefits/side-effects/alternatives to this medication were discussed in detail with the patient and time was given for questions. The patient consents to medication trial.                -- Metabolic profile and EKG monitoring obtained while on an atypical antipsychotic (BMI: Lipid Panel: HbgA1c: QTc:)              -- Encouraged patient to participate in unit milieu and in scheduled group therapies                            3. Medical Issues Being Addressed:  Gabapentin  is initiated for epilepsy 600 mg 3 times daily.  Patient will need to follow-up with neurology.   4. Discharge Planning:              -- Social work and case management to assist with discharge planning and identification of hospital follow-up needs prior to discharge             -- Estimated LOS: 5-7 days             -- Discharge Concerns: Need to establish a safety plan; Medication compliance and effectiveness              -- Discharge Goals: Return home with outpatient referrals follow ups  Physician Treatment Plan for Primary Diagnosis: Bipolar 1 disorder, mixed, severe (HCC) Long Term Goal(s): Improvement in symptoms so as ready for discharge  Short Term Goals: Ability to demonstrate self-control will improve, Ability to identify and develop effective coping behaviors will improve, Ability to maintain clinical measurements within normal limits will improve, Compliance with prescribed medications will improve, and Ability to identify triggers associated with substance abuse/mental health issues will improve  I certify that inpatient services furnished can reasonably be expected to improve the patient's condition.    Donnice FORBES Right, PA-C 8/3/20255:19 PM

## 2023-09-12 NOTE — Progress Notes (Signed)
   09/12/23 1100  Psych Admission Type (Psych Patients Only)  Admission Status Involuntary  Psychosocial Assessment  Patient Complaints Anxiety;Anhedonia  Eye Contact Brief  Facial Expression Flat  Affect Flat  Speech Logical/coherent  Interaction Assertive  Motor Activity Slow  Appearance/Hygiene In scrubs  Behavior Characteristics Cooperative  Mood Preoccupied  Thought Process  Coherency WDL  Content WDL  Delusions WDL  Perception WDL  Hallucination None reported or observed  Judgment Impaired  Confusion None  Danger to Self  Current suicidal ideation? Denies  Agreement Not to Harm Self Yes  Description of Agreement verbal  Danger to Others  Danger to Others None reported or observed   Patient stayed in bed most of the shift. Compliant with medications. Support and encouragement given.

## 2023-09-13 DIAGNOSIS — R079 Chest pain, unspecified: Secondary | ICD-10-CM

## 2023-09-13 DIAGNOSIS — R001 Bradycardia, unspecified: Secondary | ICD-10-CM

## 2023-09-13 LAB — HEMOGLOBIN A1C
Hgb A1c MFr Bld: 4.8 % (ref 4.8–5.6)
Mean Plasma Glucose: 91 mg/dL

## 2023-09-13 MED ORDER — HYDROXYZINE HCL 25 MG PO TABS
25.0000 mg | ORAL_TABLET | Freq: Four times a day (QID) | ORAL | Status: DC | PRN
  Administered 2023-09-13 – 2023-09-15 (×5): 25 mg via ORAL
  Filled 2023-09-13 (×5): qty 1

## 2023-09-13 MED ORDER — TRAZODONE HCL 50 MG PO TABS
50.0000 mg | ORAL_TABLET | Freq: Every evening | ORAL | Status: DC | PRN
  Administered 2023-09-13 – 2023-09-14 (×2): 50 mg via ORAL
  Filled 2023-09-13 (×2): qty 1

## 2023-09-13 NOTE — Group Note (Signed)
 Date:  09/13/2023 Time:  8:28 PM  Group Topic/Focus:  Wrap-Up Group:   The focus of this group is to help patients review their daily goal of treatment and discuss progress on daily workbooks.    Participation Level:  Active  Participation Quality:  Intrusive and Inattentive  Affect:  Irritable and Resistant  Cognitive:  Delusional  Insight: Lacking  Engagement in Group:  Off Topic  Modes of Intervention:  Clarification, Discussion, Rapport Building, and Support  Additional Comments:     Jennifer Woods 09/13/2023, 8:28 PM

## 2023-09-13 NOTE — Group Note (Signed)
 Recreation Therapy Group Note   Group Topic:General Recreation  Group Date: 09/13/2023 Start Time: 1035 End Time: 1145 Facilitators: Celestia Jeoffrey BRAVO, LRT, CTRS Location: Courtyard  Group Description: [employer]. LRT and patients played games of basketball, drew with chalk, and played corn hole while outside in the courtyard while getting fresh air and sunlight. Music was being played in the background. LRT and peers conversed about different games they have played before, what they do in their free time and anything else that is on their minds. LRT encouraged pts to drink water after being outside, sweating and getting their heart rate up.  Goal Area(s) Addressed: Patient will build on frustration tolerance skills. Patients will partake in a competitive play game with peers. Patients will gain knowledge of new leisure interest/hobby.    Affect/Mood: N/A   Participation Level: Did not attend    Clinical Observations/Individualized Feedback: Patient did not attend group.   Plan: Continue to engage patient in RT group sessions 2-3x/week.   Jeoffrey BRAVO Celestia, LRT, CTRS 09/13/2023 1:48 PM

## 2023-09-13 NOTE — BHH Counselor (Signed)
 CSW spoke with [facility] May, partner, [phone].    He reports she said she was having seizures.  He reports by the time she got done talking at [facility] they decided that she would be better at [facility].  He reports that once at the hospital she couldn't walk, she could walk and talk and everything before that.    He denies that patient is a danger to self or others.   He reports that there is access to weapons.  He reports that weapons are hid by a closet door that she won't go behind.  When asked about establishing safety precautions with the weapons, he agreed to put safety precautions in place.   He reports that the patient hasn't been on medication the whole time I know her because she said she doesn't like the way they make her feel.  He reports that they have been in a relationship since around February 14th.    Sherryle Margo, MSW, LCSW 09/13/2023 11:09 AM

## 2023-09-13 NOTE — BHH Suicide Risk Assessment (Signed)
 BHH INPATIENT:  Family/Significant Other Suicide Prevention Education  Suicide Prevention Education:  Education Completed; Sudie Beal, partner, [phone],  (name of family member/significant other) has been identified by the patient as the family member/significant other with whom the patient will be residing, and identified as the person(s) who will aid the patient in the event of a mental health crisis (suicidal ideations/suicide attempt).  With written consent from the patient, the family member/significant other has been provided the following suicide prevention education, prior to the and/or following the discharge of the patient.  The suicide prevention education provided includes the following: Suicide risk factors Suicide prevention and interventions National Suicide Hotline telephone number [facility] assessment telephone number [facility] Emergency Assistance 911 [facility] and/or Residential Mobile Crisis Unit telephone number  Request made of family/significant other to: Remove weapons (e.g., guns, rifles, knives), all items previously/currently identified as safety concern.   Remove drugs/medications (over-the-counter, prescriptions, illicit drugs), all items previously/currently identified as a safety concern.  The family member/significant other verbalizes understanding of the suicide prevention education information provided.  The family member/significant other agrees to remove the items of safety concern listed above.  Sherryle JINNY Margo 09/13/2023, 11:04 AM

## 2023-09-13 NOTE — Plan of Care (Signed)
   Problem: Education: Goal: Knowledge of [facility] Education information/materials will improve Outcome: Progressing Goal: Emotional status will improve Outcome: Progressing Goal: Mental status will improve Outcome: Progressing

## 2023-09-13 NOTE — Progress Notes (Signed)
 [facility] MD Progress Note  09/13/2023 1:48 PM Jennifer Woods  MRN:  [id number]  Patient is a 43 year old female with a history of epilepsy, autistic disorder as listed in chart, bipolar I disorder, insomnia, anxiety, and ptsd.  Who initially presented to the ED following seizure.  Collateral from significant other indicated that it was the anniversary of her son's death and that she had recently relapsed on drugs following this for the past 8 days she had been using cocaine and not sleeping.  There is also some report of Suboxone use.  Patient indicates they have been off medications for seizures and bipolar disorders since at least last year.  She notes she was previously on Dilantin .  Attending psychiatrist Dr. Tena room and recommended gabapentin  600 mg 3-4 times daily, on ED note.  EKG in ED indicated acute MI which the emergency department ruled out.  QTc was prolonged at greater than 500.  Patient was started on Abilify , Cogentin , gabapentin .   Subjective:  Chart reviewed, case discussed in multidisciplinary meeting, patient seen during rounds.   Patient seen today for follow-up psychiatric evaluation. Upon approach, she immediately states, "I need to go home." She denies any need for inpatient admission and denies experiencing paranoia, hallucinations, or delusional thoughts. She reports a recent relapse involving cocaine use and poor sleep but states she feels fine at this time. Urine drug screen is positive for cocaine. She denies suicidal ideation (SI) and homicidal ideation (HI), and reports mood is currently stable. She acknowledges a recent "panic attack," as noted by nursing staff, and reports hydroxyzine  has been effective in reducing her symptoms. She also notes the recent anniversary of her son's death but denies experiencing significant depressive symptoms or low mood related to this event.  Patient presentation is notable for recent cocaine relapse with associated insomnia and emotional  dysregulation. Although she currently denies psychotic symptoms and describes her mood as stable, recent drug use and impulsive behavior raise concern for judgment impairment. Denial of the emotional impact of her son's death and minimization of psychiatric needs may reflect poor insight. Hydroxyzine  appears to be helping with anxiety symptoms. Her statement of readiness for discharge is premature given recent substance use, questionable insight, and recent acute anxiety symptoms.  Patient continues to require this acute inpatient psychiatric setting for safety, monitoring, and substance use stabilization, as well as to further assess mood and insight.  Sleep: Fair  Appetite:  Good  Past Psychiatric History: see h&P Family History: History reviewed. No pertinent family history. Social History:  Social History   Substance and Sexual Activity  Alcohol Use No     Social History   Substance and Sexual Activity  Drug Use Yes   Types: Marijuana, Cocaine, Oxycodone    Social History   Socioeconomic History   Marital status: Single    Spouse name: Not on file   Number of children: Not on file   Years of education: Not on file   Highest education level: Not on file  Occupational History   Not on file  Tobacco Use   Smoking status: Every Day    Current packs/day: 0.10    Types: Cigarettes   Smokeless tobacco: Never  Substance and Sexual Activity   Alcohol use: No   Drug use: Yes    Types: Marijuana, Cocaine, Oxycodone   Sexual activity: Yes  Other Topics Concern   Not on file  Social History Narrative   Not on file   Social Drivers of Health  Financial Resource Strain: High Risk (09/03/2023)   Received from [facility]   Overall Financial Resource Strain (CARDIA)    Difficulty of Paying Living Expenses: Very hard  Food Insecurity: No Food Insecurity (09/11/2023)   Hunger Vital Sign    Worried About Running Out of Food in the Last Year: Never true    Ran  Out of Food in the Last Year: Never true  Recent Concern: Food Insecurity - Food Insecurity Present (09/03/2023)   Received from [facility]   Hunger Vital Sign    Within the past 12 months, you worried that your food would run out before you got the money to buy more.: Sometimes true    Within the past 12 months, the food you bought just didn't last and you didn't have money to get more.: Sometimes true  Transportation Needs: No Transportation Needs (09/11/2023)   PRAPARE - Administrator, Civil Service (Medical): No    Lack of Transportation (Non-Medical): No  Recent Concern: Transportation Needs - Unmet Transportation Needs (09/03/2023)   Received from [facility] - Transportation    In the past 12 months, has lack of transportation kept you from medical appointments or from getting medications?: Yes    Lack of Transportation (Non-Medical): Yes  Physical Activity: Not on file  Stress: Not on file  Social Connections: Not on file   Past Medical History:  Past Medical History:  Diagnosis Date   Autistic disorder    Bipolar 1 disorder (HCC)    Seizures (HCC)    History reviewed. No pertinent surgical history.  Current Medications: Current Facility-Administered Medications  Medication Dose Route Frequency Provider Last Rate Last Admin   acetaminophen  (TYLENOL ) tablet 650 mg  650 mg Oral Q6H PRN Hampton, Tracie B, NP   650 mg at 09/13/23 0820   alum & mag hydroxide-simeth (MAALOX/MYLANTA) 200-200-20 MG/5ML suspension 30 mL  30 mL Oral Q4H PRN Hampton, Tracie B, NP       ARIPiprazole  (ABILIFY ) tablet 10 mg  10 mg Oral Daily Hampton, Tracie B, NP   10 mg at 09/13/23 9179   gabapentin  (NEURONTIN ) capsule 600 mg  600 mg Oral TID Millington, Matthew E, PA-C   600 mg at 09/13/23 1214   hydrOXYzine  (ATARAX ) tablet 25 mg  25 mg Oral Q6H PRN Cleotilde Hoy HERO, NP   25 mg at 09/13/23 0913   magnesium  hydroxide (MILK OF MAGNESIA) suspension  30 mL  30 mL Oral Daily PRN Hampton, Tracie B, NP       nicotine  (NICODERM CQ  - dosed in mg/24 hours) patch 14 mg  14 mg Transdermal Daily Millington, Matthew E, PA-C   14 mg at 09/13/23 9081   nicotine  polacrilex (NICORETTE ) gum 2 mg  2 mg Oral PRN Millington, Matthew E, PA-C   2 mg at 09/13/23 9082    Lab Results:  Results for orders placed or performed during the hospital encounter of 09/11/23 (from the past 48 hours)  Lipid panel     Status: None   Collection Time: 09/12/23 12:44 PM  Result Value Ref Range   Cholesterol 158 0 - 200 mg/dL   Triglycerides 66 <849 mg/dL   HDL 55 >59 mg/dL   Total CHOL/HDL Ratio 2.9 RATIO   VLDL 13 0 - 40 mg/dL   LDL Cholesterol 90 0 - 99 mg/dL    Comment:        Total Cholesterol/HDL:CHD Risk Coronary Heart Disease  Risk Table                     Men   Women  1/2 Average Risk   3.4   3.3  Average Risk       5.0   4.4  2 X Average Risk   9.6   7.1  3 X Average Risk  23.4   11.0        Use the calculated Patient Ratio above and the CHD Risk Table to determine the patient's CHD Risk.        ATP III CLASSIFICATION (LDL):  <100     mg/dL   Optimal  899-870  mg/dL   Near or Above                    Optimal  130-159  mg/dL   Borderline  839-810  mg/dL   High  >809     mg/dL   Very High Performed at [facility], [address] [place], [place] [address]   Hemoglobin A1c     Status: None   Collection Time: 09/12/23 12:44 PM  Result Value Ref Range   Hgb A1c MFr Bld 4.8 4.8 - 5.6 %    Comment: (NOTE)         Prediabetes: 5.7 - 6.4         Diabetes: >6.4         Glycemic control for adults with diabetes: <7.0    Mean Plasma Glucose 91 mg/dL    Comment: (NOTE) Performed At: [facility] [address] [place], [place] [id number] Jennette Shorter MD Ey:[id number]   TSH     Status: None   Collection Time: 09/12/23 12:44 PM  Result Value Ref Range   TSH 1.841 0.350 - 4.500 uIU/mL    Comment: Performed by a 3rd Generation  assay with a functional sensitivity of <=0.01 uIU/mL. Performed at [facility], [address] [place], [place] [address]   VITAMIN D  25 Hydroxy (Vit-D Deficiency, Fractures)     Status: None   Collection Time: 09/12/23 12:44 PM  Result Value Ref Range   Vit D, 25-Hydroxy 36.82 30 - 100 ng/mL    Comment: (NOTE) Vitamin D  deficiency has been defined by the Institute of Medicine  and an Endocrine Society practice guideline as a level of serum 25-OH  vitamin D  less than 20 ng/mL (1,2). The Endocrine Society went on to  further define vitamin D  insufficiency as a level between 21 and 29  ng/mL (2).  1. IOM (Institute of Medicine). 2010. Dietary reference intakes for  calcium and D. [place]: [employer]. 2. Holick MF, Binkley [place], Bischoff-Ferrari HA, et al. Evaluation,  treatment, and prevention of vitamin D  deficiency: an Endocrine  Society clinical practice guideline, JCEM. 2011 Jul; 96(7): 1911-30.  Performed at [facility], [address], [place], [place] [address]     Blood Alcohol level:  Lab Results  Component Value Date   [facility] <15 09/09/2023   ETH <5 08/12/2016    Metabolic Disorder Labs: Lab Results  Component Value Date   HGBA1C 4.8 09/12/2023   MPG 91 09/12/2023   MPG 97 06/07/2016   No results found for: PROLACTIN Lab Results  Component Value Date   CHOL 158 09/12/2023   TRIG 66 09/12/2023   HDL 55 09/12/2023   CHOLHDL 2.9 09/12/2023   VLDL 13 09/12/2023   LDLCALC 90 09/12/2023  LDLCALC 61 06/07/2016      Psychiatric Specialty Exam:  Presentation  General Appearance:  Casual  Eye Contact: Fair  Speech: Clear and Coherent  Speech Volume: Normal    Mood and Affect  Mood: Euthymic  Affect: Congruent   Thought Process  Thought Processes: Coherent  Descriptions of Associations:Intact  Orientation:Full (Time, Place and Person)  Thought Content:Logical  Hallucinations:Hallucinations:  None  Ideas of Reference:None  Suicidal Thoughts:Suicidal Thoughts: No  Homicidal Thoughts:Homicidal Thoughts: No   Sensorium  Memory: Immediate Fair; Recent Fair  Judgment: Fair  Insight: Fair   Art therapist  Concentration: Fair  Attention Span: Fair  Recall: [employer] of Knowledge: Fair  Language: Fair   Psychomotor Activity  Psychomotor Activity: Psychomotor Activity: Normal  Musculoskeletal: Strength & Muscle Tone: within normal limits Gait & Station: normal Assets  Assets: Manufacturing systems engineer; Desire for Improvement    Physical Exam: Physical Exam ROS Blood pressure (!) 107/57, pulse (!) 51, temperature (!) 97.5 F (36.4 C), resp. rate 18, height 5' (1.524 m), weight 68 kg, last menstrual period 08/29/2023, SpO2 97%. Body mass index is 29.28 kg/m.  Diagnosis: Principal Problem:   Bipolar 1 disorder, mixed, severe (HCC) Active Problems:   Substance abuse (HCC)   PLAN: Safety and Monitoring:  -- Voluntary admission to inpatient psychiatric unit for safety, stabilization and treatment  -- Daily contact with patient to assess and evaluate symptoms and progress in treatment  -- Patient's case to be discussed in multi-disciplinary team meeting  -- Observation Level : q15 minute checks  -- Vital signs:  q12 hours  -- Precautions: suicide, elopement, and assault -- Encouraged patient to participate in unit milieu and in scheduled group therapies   2. Psychiatric Diagnoses and Treatment:   Patient initially presented with altered mental status secondary to seizures secondary to known epilepsy and substance use in the context of medication noncompliance. Patient with ongoing depression and PTSD. Patient with recent exacerbation of depression symptoms following anniversary of son's death. Patient denying SI HI and AVH today. They are agreeable with restarting medications but wished to minimize medications. Patient requires inpatient  psychiatric hospitalization for medication management and stabilization given impulsivity likely secondary to substance use and least recent lack of sleep.   Principal Diagnosis: Bipolar 1 disorder, mixed, severe (HCC) Diagnosis:  Principal Problem:   Bipolar 1 disorder, mixed, severe (HCC) Active Problems:   Substance abuse (HCC)   Abilify  10mg  po daily      3. Medical Issues Being Addressed: Repeat EKG Gabapentin  is initiated for epilepsy 600 mg 3 times daily.  Patient will need to follow-up with neurology.    4. Discharge Planning:   -- Social work and case management to assist with discharge planning and identification of hospital follow-up needs prior to discharge  -- Estimated LOS: 3-4 days  Hoy CHRISTELLA Pinal, NP 09/13/2023, 1:48 PM

## 2023-09-13 NOTE — Progress Notes (Signed)
   09/13/23 0820  Psych Admission Type (Psych Patients Only)  Admission Status Involuntary  Psychosocial Assessment  Patient Complaints Anxiety  Eye Contact Avertive;Brief  Facial Expression Animated;Anxious  Affect Angry;Irritable  Speech Logical/coherent  Interaction Assertive;Attention-seeking;Demanding;Hostile;Needy  Motor Activity Restless  Appearance/Hygiene Disheveled;Poor hygiene  Behavior Characteristics Agitated;Anxious;Fidgety;Hyperactive;Impulsive;Irritable  Mood Anxious (pt waxes and wanes from calm to anxious)  Aggressive Behavior  Effect No apparent injury  Thought Process  Coherency WDL  Content Blaming others;Preoccupation  Delusions WDL  Perception WDL  Hallucination None reported or observed  Judgment Poor  Confusion WDL  Danger to Self  Current suicidal ideation? Denies  Agreement Not to Harm Self Yes  Description of Agreement verbal  Danger to Others  Danger to Others None reported or observed

## 2023-09-13 NOTE — Progress Notes (Signed)
 Pt reported she is having a panic attack with chest pain/tightness.  NP notified. Pt who appeared calm upon assessment now observed getting increasingly aggitated on phone call. Hydroxizine given po then12 lead EKG performed.  Read out identifies bradycardia otherwise normal.  Nicotine  patch and gum given per pt request after she reported chest tightness 0/10.  Pt anxious with pressured speech.  Nicotine  products given per pt request but RN did caution pt that nicotine  is a stimulant.  Pt then stated that the nicotine  withdrawal and what she learned about what is going on at home is causing her anxiety.  RN suggested that pt not engage in such conversation until she feels able to remain calm and that her health is top priority.

## 2023-09-13 NOTE — BH IP Treatment Plan (Signed)
 Interdisciplinary Treatment and Diagnostic Plan Update  09/13/2023 Time of Session: 10:38AM Jennifer Woods MRN: [id number]  Principal Diagnosis: Bipolar 1 disorder, mixed, severe (HCC)  Secondary Diagnoses: Principal Problem:   Bipolar 1 disorder, mixed, severe (HCC) Active Problems:   Substance abuse (HCC)   Current Medications:  Current Facility-Administered Medications  Medication Dose Route Frequency Provider Last Rate Last Admin   acetaminophen  (TYLENOL ) tablet 650 mg  650 mg Oral Q6H PRN Hampton, Tracie B, NP   650 mg at 09/13/23 0820   alum & mag hydroxide-simeth (MAALOX/MYLANTA) 200-200-20 MG/5ML suspension 30 mL  30 mL Oral Q4H PRN Hampton, Tracie B, NP       ARIPiprazole  (ABILIFY ) tablet 10 mg  10 mg Oral Daily Hampton, Tracie B, NP   10 mg at 09/13/23 0820   gabapentin  (NEURONTIN ) capsule 600 mg  600 mg Oral TID Millington, Matthew E, PA-C   600 mg at 09/13/23 9180   hydrOXYzine  (ATARAX ) tablet 25 mg  25 mg Oral Q6H PRN Cleotilde Hoy CHRISTELLA, NP   25 mg at 09/13/23 0913   magnesium  hydroxide (MILK OF MAGNESIA) suspension 30 mL  30 mL Oral Daily PRN Hampton, Tracie B, NP       nicotine  (NICODERM CQ  - dosed in mg/24 hours) patch 14 mg  14 mg Transdermal Daily Millington, Matthew E, PA-C   14 mg at 09/13/23 9081   nicotine  polacrilex (NICORETTE ) gum 2 mg  2 mg Oral PRN Millington, Matthew E, PA-C   2 mg at 09/13/23 9082   PTA Medications: No medications prior to admission.    Patient Stressors: Substance abuse    Patient Strengths: Supportive family/friends   Treatment Modalities: Medication Management, Group therapy, Case management,  1 to 1 session with clinician, Psychoeducation, Recreational therapy.   Physician Treatment Plan for Primary Diagnosis: Bipolar 1 disorder, mixed, severe (HCC) Long Term Goal(s): Improvement in symptoms so as ready for discharge   Short Term Goals: Ability to demonstrate self-control will improve Ability to identify and develop  effective coping behaviors will improve Ability to maintain clinical measurements within normal limits will improve Compliance with prescribed medications will improve Ability to identify triggers associated with substance abuse/mental health issues will improve  Medication Management: Evaluate patient's response, side effects, and tolerance of medication regimen.  Therapeutic Interventions: 1 to 1 sessions, Unit Group sessions and Medication administration.  Evaluation of Outcomes: Not Met  Physician Treatment Plan for Secondary Diagnosis: Principal Problem:   Bipolar 1 disorder, mixed, severe (HCC) Active Problems:   Substance abuse (HCC)  Long Term Goal(s): Improvement in symptoms so as ready for discharge   Short Term Goals: Ability to demonstrate self-control will improve Ability to identify and develop effective coping behaviors will improve Ability to maintain clinical measurements within normal limits will improve Compliance with prescribed medications will improve Ability to identify triggers associated with substance abuse/mental health issues will improve     Medication Management: Evaluate patient's response, side effects, and tolerance of medication regimen.  Therapeutic Interventions: 1 to 1 sessions, Unit Group sessions and Medication administration.  Evaluation of Outcomes: Not Met   RN Treatment Plan for Primary Diagnosis: Bipolar 1 disorder, mixed, severe (HCC) Long Term Goal(s): Knowledge of disease and therapeutic regimen to maintain health will improve  Short Term Goals: Ability to demonstrate self-control, Ability to participate in decision making will improve, Ability to verbalize feelings will improve, Ability to disclose and discuss suicidal ideas, Ability to identify and develop effective coping behaviors will improve, and Compliance  with prescribed medications will improve  Medication Management: RN will administer medications as ordered by provider,  will assess and evaluate patient's response and provide education to patient for prescribed medication. RN will report any adverse and/or side effects to prescribing provider.  Therapeutic Interventions: 1 on 1 counseling sessions, Psychoeducation, Medication administration, Evaluate responses to treatment, Monitor vital signs and CBGs as ordered, Perform/monitor CIWA, COWS, AIMS and Fall Risk screenings as ordered, Perform wound care treatments as ordered.  Evaluation of Outcomes: Not Met   LCSW Treatment Plan for Primary Diagnosis: Bipolar 1 disorder, mixed, severe (HCC) Long Term Goal(s): Safe transition to appropriate next level of care at discharge, Engage patient in therapeutic group addressing interpersonal concerns.  Short Term Goals: Engage patient in aftercare planning with referrals and resources, Increase social support, Increase ability to appropriately verbalize feelings, Increase emotional regulation, Facilitate acceptance of mental health diagnosis and concerns, Facilitate patient progression through stages of change regarding substance use diagnoses and concerns, Identify triggers associated with mental health/substance abuse issues, and Increase skills for wellness and recovery  Therapeutic Interventions: Assess for all discharge needs, 1 to 1 time with Social worker, Explore available resources and support systems, Assess for adequacy in community support network, Educate family and significant other(s) on suicide prevention, Complete Psychosocial Assessment, Interpersonal group therapy.  Evaluation of Outcomes: Not Met   Progress in Treatment: Attending groups: No. Participating in groups: No. Taking medication as prescribed: Yes. Toleration medication: Yes. Family/Significant other contact made: No, will contact:  once permission has been granted.  Patient understands diagnosis: Yes. Discussing patient identified problems/goals with staff: Yes. Medical problems  stabilized or resolved: Yes. Denies suicidal/homicidal ideation: Yes. Issues/concerns per patient self-inventory: No. Other: none  New problem(s) identified: No, Describe:  none  New Short Term/Long Term Goal(s): detox, elimination of symptoms of psychosis, medication management for mood stabilization; elimination of SI thoughts; development of comprehensive mental wellness/sobriety plan.   Patient Goals:  get back on my mental health meds  Discharge Plan or Barriers: CSW to assist in the development of appropriate discharge plans.    Reason for Continuation of Hospitalization: Anxiety Depression Hallucinations Medication stabilization Suicidal ideation Withdrawal symptoms  Estimated Length of Stay:  1-7 days  Last 3 [place] Suicide Severity Risk Score: Flowsheet Row Admission (Current) from 09/11/2023 in [facility] INPATIENT BEH[facility]INE ED from 09/09/2023 in [facility] Emergency Department at [facility] ED from 09/03/2023 in [facility] Emergency Department at [facility]  C-SSRS RISK CATEGORY Error: Q3, 4, or 5 should not be populated when Q2 is No No Risk No Risk    Last PHQ 2/9 Scores:     No data to display          Scribe for Treatment Team: Sherryle JINNY Margo, LCSW 09/13/2023 10:53 AM

## 2023-09-13 NOTE — Plan of Care (Signed)
  Problem: Education: Goal: Knowledge of [facility] Education information/materials will improve Outcome: Progressing Goal: Emotional status will improve Outcome: Progressing Goal: Mental status will improve Outcome: Progressing Goal: Verbalization of understanding the information provided will improve Outcome: Progressing   Problem: Activity: Goal: Interest or engagement in activities will improve Outcome: Progressing Goal: Sleeping patterns will improve Outcome: Progressing   Problem: Coping: Goal: Ability to verbalize frustrations and anger appropriately will improve Outcome: Progressing Goal: Ability to demonstrate self-control will improve Outcome: Progressing   Problem: Health Behavior/Discharge Planning: Goal: Identification of resources available to assist in meeting health care needs will improve Outcome: Progressing Goal: Compliance with treatment plan for underlying cause of condition will improve Outcome: Progressing   Problem: Physical Regulation: Goal: Ability to maintain clinical measurements within normal limits will improve Outcome: Progressing   Problem: Safety: Goal: Periods of time without injury will increase Outcome: Progressing   Problem: Education: Goal: Knowledge of [facility] Education information/materials will improve Outcome: Progressing Goal: Emotional status will improve Outcome: Progressing Goal: Mental status will improve Outcome: Progressing Goal: Verbalization of understanding the information provided will improve Outcome: Progressing   Problem: Activity: Goal: Interest or engagement in activities will improve Outcome: Progressing Goal: Sleeping patterns will improve Outcome: Progressing   Problem: Coping: Goal: Ability to verbalize frustrations and anger appropriately will improve Outcome: Progressing Goal: Ability to demonstrate self-control will improve Outcome: Progressing   Problem: Health  Behavior/Discharge Planning: Goal: Identification of resources available to assist in meeting health care needs will improve Outcome: Progressing Goal: Compliance with treatment plan for underlying cause of condition will improve Outcome: Progressing   Problem: Physical Regulation: Goal: Ability to maintain clinical measurements within normal limits will improve Outcome: Progressing   Problem: Safety: Goal: Periods of time without injury will increase Outcome: Progressing   Problem: Education: Goal: Ability to state activities that reduce stress will improve Outcome: Progressing   Problem: Coping: Goal: Ability to identify and develop effective coping behavior will improve Outcome: Progressing   Problem: Self-Concept: Goal: Ability to identify factors that promote anxiety will improve Outcome: Progressing Goal: Level of anxiety will decrease Outcome: Progressing Goal: Ability to modify response to factors that promote anxiety will improve Outcome: Progressing

## 2023-09-13 NOTE — Progress Notes (Signed)
   09/12/23 2100  Psych Admission Type (Psych Patients Only)  Admission Status Involuntary  Psychosocial Assessment  Patient Complaints Anxiety  Eye Contact Avertive;Brief  Facial Expression Anxious  Affect Angry  Speech Logical/coherent  Interaction Assertive;Attention-seeking  Motor Activity Restless  Appearance/Hygiene Improved  Behavior Characteristics Cooperative  Mood Anxious  Thought Process  Coherency WDL  Content WDL  Delusions WDL  Perception WDL  Hallucination None reported or observed  Judgment WDL  Confusion WDL   Patient is alert and oriented x 4, affect is blunted, thoughts are organized, forwards very little information, she denies SI/HI/AVH, she isolates to the room. 15 minutes safety checks maintained.
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
## 2023-09-14 NOTE — Progress Notes (Signed)
   09/13/23 2000  Psych Admission Type (Psych Patients Only)  Admission Status Involuntary  Psychosocial Assessment  Patient Complaints Anxiety  Eye Contact Avertive  Facial Expression Animated;Anxious  Affect Angry;Irritable  Speech Logical/coherent  Interaction Assertive;Attention-seeking  Motor Activity Restless  Appearance/Hygiene Improved  Behavior Characteristics Cooperative  Mood Anxious  Aggressive Behavior  Effect No apparent injury  Thought Process  Coherency WDL  Content WDL  Delusions WDL  Perception WDL  Hallucination None reported or observed  Judgment Poor  Confusion WDL  Danger to Self  Current suicidal ideation? Denies  Danger to Others  Danger to Others None reported or observed

## 2023-09-14 NOTE — Progress Notes (Signed)
 [facility] MD Progress Note  09/14/2023 2:10 PM Jennifer Woods  MRN:  [id number]  Patient is a 43 year old female with a history of epilepsy, autistic disorder as listed in chart, bipolar I disorder, insomnia, anxiety, and ptsd.  Who initially presented to the ED following seizure.  Collateral from significant other indicated that it was the anniversary of her son's death and that she had recently relapsed on drugs following this for the past 8 days she had been using cocaine and not sleeping.  There is also some report of Suboxone use.  Patient indicates they have been off medications for seizures and bipolar disorders since at least last year.  She notes she was previously on Dilantin .  Attending psychiatrist Dr. Tena room and recommended gabapentin  600 mg 3-4 times daily, on ED note.  EKG in ED indicated acute MI which the emergency department ruled out.  QTc was prolonged at greater than 500.  Patient was started on Abilify , Cogentin , gabapentin .   Subjective:  Chart reviewed, case discussed in multidisciplinary meeting, patient seen during rounds.   Patient seen today for follow-up psychiatric evaluation. She continues to express that she is ready to go home. She recalls the events leading to her admission, which included positive substance use, sedation, and confusion, raising concern for decompensated bipolar disorder. She reports that the substance relapse was a one-time event, stating she had been clean for several months prior.  She reports a positive response to Abilify  in the past for mood regulation. She also reports good sleep last night with Trazodone . She denies suicidal ideation (SI), homicidal ideation (HI), auditory or visual hallucinations, paranoia, and delusions. No side effects are reported or noted. Her significant other also denies safety concerns in the outpatient setting. She has outpatient psychiatric follow-up arranged with [facility].   09/13/23: Patient seen today for follow-up  psychiatric evaluation. Upon approach, she immediately states, "I need to go home." She denies any need for inpatient admission and denies experiencing paranoia, hallucinations, or delusional thoughts. She reports a recent relapse involving cocaine use and poor sleep but states she feels fine at this time. Urine drug screen is positive for cocaine. She denies suicidal ideation (SI) and homicidal ideation (HI), and reports mood is currently stable. She acknowledges a recent "panic attack," as noted by nursing staff, and reports hydroxyzine  has been effective in reducing her symptoms. She also notes the recent anniversary of her son's death but denies experiencing significant depressive symptoms or low mood related to this event.  Patient presentation is notable for recent cocaine relapse with associated insomnia and emotional dysregulation. Although she currently denies psychotic symptoms and describes her mood as stable, recent drug use and impulsive behavior raise concern for judgment impairment. Denial of the emotional impact of her son's death and minimization of psychiatric needs may reflect poor insight. Hydroxyzine  appears to be helping with anxiety symptoms. Her statement of readiness for discharge is premature given recent substance use, questionable insight, and recent acute anxiety symptoms.  Patient continues to require this acute inpatient psychiatric setting for safety, monitoring, and substance use stabilization, as well as to further assess mood and insight.  Sleep: Fair  Appetite:  Good  Past Psychiatric History: see h&P Family History: History reviewed. No pertinent family history. Social History:  Social History   Substance and Sexual Activity  Alcohol Use No     Social History   Substance and Sexual Activity  Drug Use Yes   Types: Marijuana, Cocaine, Oxycodone    Social History  Socioeconomic History   Marital status: Single    Spouse name: Not on file   Number of  children: Not on file   Years of education: Not on file   Highest education level: Not on file  Occupational History   Not on file  Tobacco Use   Smoking status: Every Day    Current packs/day: 0.10    Types: Cigarettes   Smokeless tobacco: Never  Substance and Sexual Activity   Alcohol use: No   Drug use: Yes    Types: Marijuana, Cocaine, Oxycodone   Sexual activity: Yes  Other Topics Concern   Not on file  Social History Narrative   Not on file   Social Drivers of Health   Financial Resource Strain: High Risk (09/03/2023)   Received from [facility]   Overall Financial Resource Strain (CARDIA)    Difficulty of Paying Living Expenses: Very hard  Food Insecurity: No Food Insecurity (09/11/2023)   Hunger Vital Sign    Worried About Running Out of Food in the Last Year: Never true    Ran Out of Food in the Last Year: Never true  Recent Concern: Food Insecurity - Food Insecurity Present (09/03/2023)   Received from [facility]   Hunger Vital Sign    Within the past 12 months, you worried that your food would run out before you got the money to buy more.: Sometimes true    Within the past 12 months, the food you bought just didn't last and you didn't have money to get more.: Sometimes true  Transportation Needs: No Transportation Needs (09/11/2023)   PRAPARE - Administrator, Civil Service (Medical): No    Lack of Transportation (Non-Medical): No  Recent Concern: Transportation Needs - Unmet Transportation Needs (09/03/2023)   Received from [facility] - Transportation    In the past 12 months, has lack of transportation kept you from medical appointments or from getting medications?: Yes    Lack of Transportation (Non-Medical): Yes  Physical Activity: Not on file  Stress: Not on file  Social Connections: Not on file   Past Medical History:  Past Medical History:  Diagnosis Date   Autistic disorder     Bipolar 1 disorder (HCC)    Seizures (HCC)    History reviewed. No pertinent surgical history.  Current Medications: Current Facility-Administered Medications  Medication Dose Route Frequency Provider Last Rate Last Admin   acetaminophen  (TYLENOL ) tablet 650 mg  650 mg Oral Q6H PRN Hampton, Tracie B, NP   650 mg at 09/13/23 1701   alum & mag hydroxide-simeth (MAALOX/MYLANTA) 200-200-20 MG/5ML suspension 30 mL  30 mL Oral Q4H PRN Hampton, Tracie B, NP       ARIPiprazole  (ABILIFY ) tablet 10 mg  10 mg Oral Daily Hampton, Tracie B, NP   10 mg at 09/14/23 0830   gabapentin  (NEURONTIN ) capsule 600 mg  600 mg Oral TID Millington, Matthew E, PA-C   600 mg at 09/14/23 1159   hydrOXYzine  (ATARAX ) tablet 25 mg  25 mg Oral Q6H PRN Cleotilde Hoy HERO, NP   25 mg at 09/14/23 1158   magnesium  hydroxide (MILK OF MAGNESIA) suspension 30 mL  30 mL Oral Daily PRN Hampton, Tracie B, NP       nicotine  (NICODERM CQ  - dosed in mg/24 hours) patch 14 mg  14 mg Transdermal Daily Millington, Matthew E, PA-C   14 mg at 09/14/23 0830   nicotine  polacrilex (  NICORETTE ) gum 2 mg  2 mg Oral PRN Millington, Matthew E, PA-C   2 mg at 09/14/23 1158   traZODone  (DESYREL ) tablet 50 mg  50 mg Oral QHS PRN Cleotilde Hoy HERO, NP   50 mg at 09/13/23 2106    Lab Results:  No results found for this or any previous visit (from the past 48 hours).   Blood Alcohol level:  Lab Results  Component Value Date   [facility] <15 09/09/2023   ETH <5 08/12/2016    Metabolic Disorder Labs: Lab Results  Component Value Date   HGBA1C 4.8 09/12/2023   MPG 91 09/12/2023   MPG 97 06/07/2016   No results found for: PROLACTIN Lab Results  Component Value Date   CHOL 158 09/12/2023   TRIG 66 09/12/2023   HDL 55 09/12/2023   CHOLHDL 2.9 09/12/2023   VLDL 13 09/12/2023   LDLCALC 90 09/12/2023   LDLCALC 61 06/07/2016      Psychiatric Specialty Exam:  Presentation  General Appearance:  Casual  Eye Contact: Fair  Speech: Clear  and Coherent  Speech Volume: Normal    Mood and Affect  Mood: Euthymic  Affect: Congruent   Thought Process  Thought Processes: Coherent  Descriptions of Associations:Intact  Orientation:Full (Time, Place and Person)  Thought Content:Logical  Hallucinations:No data recorded  Ideas of Reference:None  Suicidal Thoughts:No data recorded  Homicidal Thoughts:No data recorded   Sensorium  Memory: good  Judgment: Improved   Insight: Fair   Chartered certified accountant: improved Fair  Attention Span: improved Fair  Recall: improved [employer] of Knowledge: Fair  Language: Fair   Psychomotor Activity  Psychomotor Activity: wnl   Musculoskeletal: Strength & Muscle Tone: within normal limits Gait & Station: normal Assets  Assets: Manufacturing systems engineer; Desire for Improvement    Physical Exam: Physical Exam ROS Blood pressure 119/68, pulse 65, temperature (!) 97.5 F (36.4 C), resp. rate 20, height 5' (1.524 m), weight 68 kg, last menstrual period 08/29/2023, SpO2 97%. Body mass index is 29.28 kg/m.  Diagnosis: Principal Problem:   Bipolar 1 disorder, mixed, severe (HCC) Active Problems:   Substance abuse (HCC)   PLAN: Safety and Monitoring:  -- Voluntary admission to inpatient psychiatric unit for safety, stabilization and treatment  -- Daily contact with patient to assess and evaluate symptoms and progress in treatment  -- Patient's case to be discussed in multi-disciplinary team meeting  -- Observation Level : q15 minute checks  -- Vital signs:  q12 hours  -- Precautions: suicide, elopement, and assault -- Encouraged patient to participate in unit milieu and in scheduled group therapies   2. Psychiatric Diagnoses and Treatment:   Patient initially presented with altered mental status secondary to seizures secondary to known epilepsy and substance use in the context of medication noncompliance. Patient with ongoing  depression and PTSD. Patient with recent exacerbation of depression symptoms following anniversary of son's death. Patient denying SI HI and AVH today.   Patient presentation is consistent with bipolar disorder, currently stable, following brief substance-related decompensation. She reports that the relapse was isolated, and she denies current substance use or psychiatric symptoms. She has demonstrated stability in this setting, is not overtly psychotic, and shows no behavioral disturbances. She is engaged in care and has outpatient psychiatric follow-up arranged through [facility]. Support from her significant other has been confirmed, with no current concerns for safety at home.  Patient continues to require this acute inpatient psychiatric setting for completion of stabilization and discharge planning.  Based on current presentation, she is appropriate for discharge tomorrow with continued outpatient support.  Principal Diagnosis: Bipolar 1 disorder, mixed, severe (HCC) Diagnosis:  Principal Problem:   Bipolar 1 disorder, mixed, severe (HCC) Active Problems:   Substance abuse (HCC)   Abilify  10mg  po daily      3. Medical Issues Being Addressed: Repeat EKG Gabapentin  is initiated for epilepsy 600 mg 3 times daily.  Patient will need to follow-up with neurology.    4. Discharge Planning:   -- Social work and case management to assist with discharge planning and identification of hospital follow-up needs prior to discharge  -- Estimated LOS: 3-4 days  Hoy CHRISTELLA Pinal, NP 09/14/2023, 2:10 PM

## 2023-09-14 NOTE — Progress Notes (Signed)
   09/14/23 1200  Psych Admission Type (Psych Patients Only)  Admission Status Involuntary  Psychosocial Assessment  Patient Complaints Irritability;Anxiety  Eye Contact Brief  Facial Expression Animated  Affect Irritable  Speech Logical/coherent;Rapid;Pressured  Interaction Assertive  Motor Activity Restless  Appearance/Hygiene Unremarkable  Behavior Characteristics Cooperative;Irritable  Mood Irritable  Thought Process  Coherency WDL  Content Blaming others  Delusions None reported or observed  Perception WDL  Hallucination None reported or observed  Judgment Poor  Confusion None  Danger to Self  Current suicidal ideation? Denies  Danger to Others  Danger to Others None reported or observed

## 2023-09-14 NOTE — Group Note (Signed)
 [facility] LCSW Group Therapy Note   Group Date: 09/13/2023 Start Time: 1300 End Time: 1400   Type of Therapy/Topic:  Group Therapy:  Emotion Regulation  Participation Level:  Active   Mood:  Description of Group:    The purpose of this group is to assist patients in learning to regulate negative emotions and experience positive emotions. Patients will be guided to discuss ways in which they have been vulnerable to their negative emotions. These vulnerabilities will be juxtaposed with experiences of positive emotions or situations, and patients challenged to use positive emotions to combat negative ones. Special emphasis will be placed on coping with negative emotions in conflict situations, and patients will process healthy conflict resolution skills.  Therapeutic Goals: Patient will identify two positive emotions or experiences to reflect on in order to balance out negative emotions:  Patient will label two or more emotions that they find the most difficult to experience:  Patient will be able to demonstrate positive conflict resolution skills through discussion or role plays:   Summary of Patient Progress:   The patient actively engaged in the group session and contributed to a discussion on navigating complex situations that elicit challenging emotions. The group explored strategies for self-regulation and techniques to challenge negative thought patterns, promoting more positive emotions and adaptive functioning. The patient was open to feedback from peers, interacted well with both peers and the facilitator, and offered supportive input that enriched the group dynamic.    Therapeutic Modalities:   Cognitive Behavioral Therapy Feelings Identification Dialectical Behavioral Therapy   Alveta CHRISTELLA Kerns, LCSW

## 2023-09-14 NOTE — Group Note (Signed)
 [facility] LCSW Group Therapy Note   Group Date: 09/14/2023 Start Time: 1330 End Time: 1405  Type of Therapy/Topic:  Group Therapy:  Feelings about Diagnosis  Participation Level:  Did Not Attend   Description of Group:    This group will allow patients to explore their thoughts and feelings about diagnoses they have received. Patients will be guided to explore their level of understanding and acceptance of these diagnoses. Facilitator will encourage patients to process their thoughts and feelings about the reactions of others to their diagnosis, and will guide patients in identifying ways to discuss their diagnosis with significant others in their lives. This group will be process-oriented, with patients participating in exploration of their own experiences as well as giving and receiving support and challenge from other group members.   Therapeutic Goals: 1. Patient will demonstrate understanding of diagnosis as evidence by identifying two or more symptoms of the disorder:  2. Patient will be able to express two feelings regarding the diagnosis 3. Patient will demonstrate ability to communicate their needs through discussion and/or role plays  Summary of Patient Progress:    X    Therapeutic Modalities:   Cognitive Behavioral Therapy Brief Therapy Feelings Identification    Sherryle JINNY Margo, LCSW

## 2023-09-14 NOTE — Plan of Care (Signed)
   Problem: Education: Goal: Knowledge of [facility] Education information/materials will improve Outcome: Progressing Goal: Emotional status will improve Outcome: Progressing Goal: Mental status will improve Outcome: Progressing Goal: Verbalization of understanding the information provided will improve Outcome: Progressing

## 2023-09-14 NOTE — Group Note (Signed)
 Date:  09/14/2023 Time:  10:52 PM  Group Topic/Focus:  Spirituality:   The focus of this group is to discuss how one's spirituality can aide in recovery.    Participation Level:  Active  Participation Quality:  Appropriate and Attentive  Affect:  Appropriate  Cognitive:  Alert and Appropriate  Insight: Appropriate and Good  Engagement in Group:  Developing/Improving and Engaged  Modes of Intervention:  Clarification, Discussion, Education, Exploration, Rapport Building, and Support  Additional Comments:     Jennifer Woods 09/14/2023, 10:52 PM

## 2023-09-14 NOTE — Group Note (Signed)
 Date:  09/14/2023 Time:  3:25 PM  Group Topic/Focus:  Goals Group:   The focus of this group is to help patients establish daily goals to achieve during treatment and discuss how the patient can incorporate goal setting into their daily lives to aide in recovery.    Participation Level:  Did Not Attend   Jennifer Woods [facility] 09/14/2023, 3:25 PM

## 2023-09-14 NOTE — Group Note (Signed)
 Recreation Therapy Group Note   Group Topic:Other  Group Date: 09/14/2023 Start Time: 1000 End Time: 1050 Facilitators: Celestia Jeoffrey FORBES ARTICE, CTRS Location: Craft Room  Activity Description/Intervention: Therapeutic Drumming. Patients with peers and staff were given the opportunity to engage in a leader facilitated [facility] Group Empowerment Drumming Circle with staff from the [employer], in partnership with [employer]. Teaching laboratory technician and trained [employer], Norleen Mon leading with LRT observing and documenting intervention and pt response. This evidenced-based practice targets 7 areas of health and wellbeing in the human experience including: stress-reduction, exercise, self-expression, camaraderie/support, nurturing, spirituality, and music-making (leisure).    Goal Area(s) Addresses:  Patient will engage in pro-social way in music group.  Patient will follow directions of drum leader on the first prompt. Patient will demonstrate no behavioral issues during group.  Patient will identify if a reduction in stress level occurs as a result of participation in therapeutic drum circle.     Affect/Mood: Elevated   Participation Level: Hyperverbal    Clinical Observations/Individualized Feedback: Shailee came into group elevated. Pt shared: I am only in group because I was bribed with ice cream. I don't even want to be in here!. Pt required cues to remain quiet and not speak over others. Pt left early and did not return.   Plan: Continue to engage patient in RT group sessions 2-3x/week.   Jeoffrey FORBES Celestia, LRT, CTRS 09/14/2023 1:21 PM

## 2023-09-14 NOTE — Group Note (Signed)
 Date:  09/14/2023 Time:  7:16 PM  Group Topic/Focus:  Coping With Mental Health Crisis:   The purpose of this group is to help patients identify strategies for coping with mental health crisis.  Group discusses possible causes of crisis and ways to manage them effectively.    Participation Level:  Did Not Attend   Camellia HERO Derhonda Eastlick 09/14/2023, 7:16 PM

## 2023-09-14 NOTE — Plan of Care (Signed)
  Problem: Education: Goal: Emotional status will improve Outcome: Progressing Goal: Mental status will improve Outcome: Progressing   Problem: Coping: Goal: Ability to verbalize frustrations and anger appropriately will improve Outcome: Progressing   Problem: Health Behavior/Discharge Planning: Goal: Compliance with treatment plan for underlying cause of condition will improve Outcome: Progressing   Problem: Physical Regulation: Goal: Ability to maintain clinical measurements within normal limits will improve Outcome: Progressing   Problem: Safety: Goal: Periods of time without injury will increase Outcome: Progressing

## 2023-09-15 ENCOUNTER — Other Ambulatory Visit: Payer: Self-pay

## 2023-09-15 MED ORDER — TRAZODONE HCL 50 MG PO TABS
50.0000 mg | ORAL_TABLET | Freq: Every evening | ORAL | 0 refills | Status: DC | PRN
  Filled 2023-09-15: qty 15, 15d supply, fill #0

## 2023-09-15 MED ORDER — GABAPENTIN 300 MG PO CAPS
600.0000 mg | ORAL_CAPSULE | Freq: Three times a day (TID) | ORAL | 0 refills | Status: DC
  Filled 2023-09-15: qty 180, 30d supply, fill #0

## 2023-09-15 MED ORDER — ARIPIPRAZOLE 10 MG PO TABS
10.0000 mg | ORAL_TABLET | Freq: Every day | ORAL | 0 refills | Status: DC
  Filled 2023-09-15: qty 30, 30d supply, fill #0

## 2023-09-15 NOTE — Progress Notes (Signed)
 [facility] Discharge Suicide Risk Assessment   Principal Problem: Bipolar 1 disorder, mixed, severe (HCC) Discharge Diagnoses: Principal Problem:   Bipolar 1 disorder, mixed, severe (HCC) Active Problems:   Substance abuse (HCC)   Total Time spent with patient: 30 minutes  Musculoskeletal: Strength & Muscle Tone: within normal limits Gait & Station: normal   Psychiatric Specialty Exam  Presentation  General Appearance:  Casual  Eye Contact: Fair  Speech: Clear and Coherent  Speech Volume: Normal  Handedness:No data recorded  Mood and Affect  Mood: Euthymic  Duration of Depression Symptoms: Less than two weeks  Affect: Congruent   Thought Process  Thought Processes: Coherent  Descriptions of Associations:Intact  Orientation:Full (Time, Place and Person)  Thought Content:Logical  History of Schizophrenia/Schizoaffective disorder:No  Duration of Psychotic Symptoms:Less than six months  Hallucinations:none Ideas of Reference: none  Suicidal Thoughts:denies  Homicidal Thoughts:denies  Sensorium  Memory: Immediate Fair; Recent Fair  Judgment: Fair  Insight: Fair   Art therapist  Concentration: Fair  Attention Span: Fair  Recall: [employer] of Knowledge: Fair  Language: Fair   Psychomotor Activity  Psychomotor Activity:wnl  Assets  Assets: Manufacturing systems engineer; Desire for Improvement   Sleep  Sleep:No data recorded Estimated Sleeping Duration (Last 24 Hours): 10.00-11.75 hours  Physical Exam: Physical Exam ROS Blood pressure (!) 108/53, pulse 86, temperature 97.6 F (36.4 C), resp. rate 20, height 5' (1.524 m), weight 68 kg, last menstrual period 08/29/2023, SpO2 98%. Body mass index is 29.28 kg/m.  Mental Status Per Nursing Assessment::   On Admission:  NA  Demographic Factors:  Caucasian  Loss Factors: NA  Historical Factors: NA  Risk Reduction Factors:   Sense of responsibility to family, Living  with another person, especially a relative, Positive social support, Positive therapeutic relationship, and Positive coping skills or problem solving skills  Continued Clinical Symptoms:  Alcohol/Substance Abuse/Dependencies Previous Psychiatric Diagnoses and Treatments  Cognitive Features That Contribute To Risk:  None    Suicide Risk:  Mild:  Suicidal ideation of limited frequency, intensity, duration, and specificity.  There are no identifiable plans, no associated intent, mild dysphoria and related symptoms, good self-control (both objective and subjective assessment), few other risk factors, and identifiable protective factors, including available and accessible social support.   Follow-up Information     [facility] Follow up.   Why: Appointment is scheduled for 09/17/2023 at 11AM. Contact information: [address] [place] [address] [phone]                  Hoy Jennifer Pinal, NP 09/15/2023, 9:08 AM

## 2023-09-15 NOTE — Plan of Care (Signed)
   Problem: Education: Goal: Emotional status will improve Outcome: Progressing Goal: Mental status will improve Outcome: Progressing

## 2023-09-15 NOTE — Plan of Care (Signed)
  Problem: Education: Goal: Knowledge of [facility] Education information/materials will improve 09/15/2023 1053 by Deidre Sartorius, RN Outcome: Completed/Met 09/15/2023 0953 by Deidre Sartorius, RN Outcome: Adequate for Discharge 09/15/2023 [phone] by Deidre Sartorius, RN Outcome: Adequate for Discharge Goal: Emotional status will improve 09/15/2023 1053 by Deidre Sartorius, RN Outcome: Completed/Met 09/15/2023 0953 by Deidre Sartorius, RN Outcome: Adequate for Discharge 09/15/2023 [phone] by Deidre Sartorius, RN Outcome: Adequate for Discharge Goal: Mental status will improve 09/15/2023 1053 by Deidre Sartorius, RN Outcome: Completed/Met 09/15/2023 0953 by Deidre Sartorius, RN Outcome: Adequate for Discharge Goal: Verbalization of understanding the information provided will improve 09/15/2023 1053 by Deidre Sartorius, RN Outcome: Completed/Met 09/15/2023 0953 by Deidre Sartorius, RN Outcome: Adequate for Discharge   Problem: Activity: Goal: Interest or engagement in activities will improve 09/15/2023 1053 by Deidre Sartorius, RN Outcome: Completed/Met 09/15/2023 0953 by Deidre Sartorius, RN Outcome: Adequate for Discharge 09/15/2023 [phone] by Deidre Sartorius, RN Outcome: Adequate for Discharge Goal: Sleeping patterns will improve 09/15/2023 1053 by Deidre Sartorius, RN Outcome: Completed/Met 09/15/2023 0953 by Deidre Sartorius, RN Outcome: Adequate for Discharge

## 2023-09-15 NOTE — Plan of Care (Signed)
  Problem: Education: Goal: Knowledge of [facility] Education information/materials will improve 09/15/2023 0953 by Deidre Sartorius, RN Outcome: Adequate for Discharge 09/15/2023 [phone] by Deidre Sartorius, RN Outcome: Adequate for Discharge Goal: Emotional status will improve 09/15/2023 0953 by Deidre Sartorius, RN Outcome: Adequate for Discharge 09/15/2023 0952 by Deidre Sartorius, RN Outcome: Adequate for Discharge Goal: Mental status will improve Outcome: Adequate for Discharge Goal: Verbalization of understanding the information provided will improve Outcome: Adequate for Discharge   Problem: Activity: Goal: Interest or engagement in activities will improve 09/15/2023 0953 by Deidre Sartorius, RN Outcome: Adequate for Discharge 09/15/2023 [phone] by Deidre Sartorius, RN Outcome: Adequate for Discharge Goal: Sleeping patterns will improve Outcome: Adequate for Discharge   Problem: Coping: Goal: Ability to verbalize frustrations and anger appropriately will improve Outcome: Adequate for Discharge Goal: Ability to demonstrate self-control will improve Outcome: Adequate for Discharge

## 2023-09-15 NOTE — Progress Notes (Signed)
   09/15/23 0900  Psych Admission Type (Psych Patients Only)  Admission Status Involuntary  Psychosocial Assessment  Patient Complaints Anxiety  Eye Contact Brief  Facial Expression Animated  Affect Irritable  Speech Logical/coherent  Interaction Assertive  Motor Activity Restless  Appearance/Hygiene Unremarkable  Behavior Characteristics Cooperative  Mood Pleasant  Thought Process  Coherency WDL  Content WDL  Delusions None reported or observed  Perception WDL  Hallucination None reported or observed  Judgment Poor  Confusion None  Danger to Self  Current suicidal ideation? Denies  Agreement Not to Harm Self Yes  Description of Agreement verbal  Danger to Others  Danger to Others None reported or observed

## 2023-09-15 NOTE — Plan of Care (Signed)
  Problem: Education: Goal: Knowledge of [facility] Education information/materials will improve Outcome: Adequate for Discharge Goal: Emotional status will improve Outcome: Adequate for Discharge   Problem: Activity: Goal: Interest or engagement in activities will improve Outcome: Adequate for Discharge

## 2023-09-15 NOTE — Progress Notes (Signed)
 Patient ID: Jennifer Woods, female   DOB: 11-23-1980, 43 y.o.   MRN: [id number] Discharge instructions, follow up appointments and medications reviewed, pt verbalized understanding. Prescriptions and belongings given to patient, patient satisfied with belongings, pt discharged home.

## 2023-09-15 NOTE — Progress Notes (Signed)
  [facility] Adult Case Management Discharge Plan :  Will you be returning to the same living situation after discharge:  Yes,  pt reports that she is returning home.  At discharge, do you have transportation home?: Yes,  pt reports that significant other will provide transportation.  Do you have the ability to pay for your medications: Yes,  [employer]  Release of information consent forms completed and in the chart;  Patient's signature needed at discharge.  Patient to Follow up at:  Follow-up Information     [facility], [facility] Follow up.   Why: Appointment is scheduled for 09/17/2023 at 11AM. Contact information: [address] [place] [address] [phone]                 Next level of care provider has access to [facility] Link:no  Safety Planning and Suicide Prevention discussed: Yes,  SPE completed with the patient and partner.      Has patient been referred to the Quitline?: Patient refused referral for treatment  Patient has been referred for addiction treatment: Yes, the patient will follow up with an outpatient provider for substance use disorder. Therapist: appointment made  Sherryle JINNY Margo, LCSW 09/15/2023, 9:41 AM

## 2023-09-15 NOTE — Progress Notes (Signed)
 Pt calm and pleasant during assessment denying SI/HI/AVH. Pt observed interacting appropriately with staff and peers on the unit. Pt compliant with medication administration per MD orders. Pt given education, support, and encouragement to be active in her treatment plan. Pt being monitored Q 15 minutes for safety per unit protocol, remains safe on the unit

## 2023-09-16 NOTE — Discharge Summary (Signed)
 Physician Discharge Summary Note  Patient:  Jennifer Woods is an 43 y.o., female MRN:  [id number] DOB:  [birth year] Patient phone:  [phone] (home)  Patient address:   [address] [place] [address],    Date of Admission:  09/11/2023 Date of Discharge: 09/16/23  Reason for Admission:    Psychiatric History:  Patient is a 43 year old female with a history of epilepsy, autistic disorder as listed in chart, bipolar I disorder, insomnia, anxiety, and ptsd.  Who initially presented to the ED following seizure.  Collateral from significant other indicated that it was the anniversary of her son's death and that she had recently relapsed on drugs following this for the past 8 days she had been using cocaine and not sleeping.  There is also some report of Suboxone use.  Patient indicates they have been off medications for seizures and bipolar disorders since at least last year.  She notes she was previously on Dilantin .  Attending psychiatrist Dr. Tena room and recommended gabapentin  600 mg 3-4 times daily, on ED note.  EKG in ED indicated acute MI which the emergency department ruled out.  QTc was prolonged at greater than 500.  Patient was started on Abilify , Cogentin , gabapentin .    Information collected from patient and chart review Prev Dx/Sx: Bipolar disorder, polysubstance use, insomnia, anxiety, and PTSD Current Psych Provider: None Home Meds (current): None Previous Med Trials: risperdal, paxil, vraylar (made manic), states have been on a bunch Therapy: None   Prior Psych Hospitalization: 09-28-16 most recent reports several since death of son in September 29, 2002 Prior Self Harm: 2 09/29/03 and 09/28/2005 Via OD Prior Violence: denies Went to prison states through some drugs out the window because evading police was lower    Family Psych History: mother bipolar and addiction Family Hx suicide: no   Social History:  Developmental Hx: normal Educational Hx: 2 years of college 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  Legal Hx: no current previous convictions for fleeing and drug charges Living Situation: [facility] Spiritual Hx: agnostic Access to weapons/lethal means: no     Substance History Alcohol: none  History of alcohol withdrawal seizures none History of DT's no  Tobacco: 1/2-1 ppd  Illicit drugs: cocaine, marijuana Prescription drug abuse: pain pills  Rehab hx: no   Principal Problem: Bipolar 1 disorder, mixed, severe (HCC) Discharge Diagnoses: Principal Problem:   Bipolar 1 disorder, mixed, severe (HCC) Active Problems:   Substance abuse (HCC)   Social History:  Social History   Substance and Sexual Activity  Alcohol Use No     Social History   Substance and Sexual Activity  Drug Use Yes   Types: Marijuana, Cocaine, Oxycodone    Social History   Socioeconomic History   Marital status: Single    Spouse name: Not on file   Number of children: Not on file   Years of education: Not on file   Highest education level: Not on file  Occupational History   Not on file  Tobacco Use   Smoking status: Every Day    Current packs/day: 0.10    Types: Cigarettes   Smokeless tobacco: Never  Substance and Sexual Activity   Alcohol use: No   Drug use: Yes    Types: Marijuana, Cocaine, Oxycodone   Sexual activity: Yes  Other Topics Concern   Not on file  Social History Narrative   Not on file   Social Drivers of Health   Financial Resource Strain: High Risk (09/03/2023)  Received from [facility]   Overall Financial Resource Strain (CARDIA)    Difficulty of Paying Living Expenses: Very hard  Food Insecurity: No Food Insecurity (09/11/2023)   Hunger Vital Sign    Worried About Running Out of Food in the Last Year: Never true    Ran Out of Food in the Last Year: Never true  Recent Concern: Food Insecurity - Food Insecurity Present (09/03/2023)   Received from [facility]   Hunger Vital Sign    Within the past  12 months, you worried that your food would run out before you got the money to buy more.: Sometimes true    Within the past 12 months, the food you bought just didn't last and you didn't have money to get more.: Sometimes true  Transportation Needs: No Transportation Needs (09/11/2023)   PRAPARE - Administrator, Civil Service (Medical): No    Lack of Transportation (Non-Medical): No  Recent Concern: Transportation Needs - Unmet Transportation Needs (09/03/2023)   Received from [facility] - Transportation    In the past 12 months, has lack of transportation kept you from medical appointments or from getting medications?: Yes    Lack of Transportation (Non-Medical): Yes  Physical Activity: Not on file  Stress: Not on file  Social Connections: Not on file   Past Medical History:  Past Medical History:  Diagnosis Date   Autistic disorder    Bipolar 1 disorder (HCC)    Seizures (HCC)    History reviewed. No pertinent surgical history. Family History: History reviewed. No pertinent family history.  Hospital Course:   43 year old female with a psychiatric and medical history of bipolar I disorder, epilepsy, autistic disorder (per chart), PTSD, anxiety, and insomnia, presented to the emergency department following a seizure.  Collateral from her significant other revealed that the admission followed the anniversary of her son's death, during which the patient had relapsed on cocaine and had not slept for eight consecutive days. She had also reportedly used Suboxone and had not taken bipolar or seizure medications since at least last year.  On presentation, she demonstrated altered mental status secondary to epilepsy, stimulant use, and medication noncompliance. ED evaluation ruled out acute MI despite EKG showing QTc >500 ms. She was stabilized medically and transferred for psychiatric care.  Initial symptoms included impulsivity, insomnia,  substance-induced disorganization, and exacerbation of depression and PTSD following the emotional anniversary of her son's death.During her stay, the following medications were initiated or resumed: Abilify  10 mg PO daily - for mood stabilization Cogentin  - for EPS prophylaxis Gabapentin  600 mg PO TID-QID - for anxiety and seizure support Trazodone  - added for insomnia The patient expressed a desire to minimize medications, and medication options for anxiety and depression were discussed. She reported a positive historical response to Abilify  and slept well with Trazodone  during the admission.  Throughout hospitalization: She consistently denied suicidal or homicidal ideation, AVH, paranoia, or delusions. She reported mood was stable denying depression and anxiety. There were no unsafe or inappropriate behaviors noted in this setting. Pt observed with noted safety and is no longer appropriate for the inpatient acute setting.  No side effects were reported or observed Her significant other denied any current safety concerns in the home environment She was cooperative with treatment, with progressive improvement in mental status as illicit substances cleared and medications were restarted  Detailed risk assessment is complete based on clinical exam and individual risk  factors and acute suicide risk is low and acute violence risk is low.     Currently, all modifiable risk of harm to self/harm to others have been addressed and patient is no longer appropriate for the acute inpatient setting and is able to continue treatment for mental health needs in the community with the supports as indicated below.  Patient is educated and verbalized understanding of discharge plan of care including medications, follow-up appointments, mental health resources and further crisis services in the community.  He is instructed to call 911 or present to the nearest emergency room should he experience any decompensation in  mood, disturbance of bowel or return of suicidal/homicidal ideations.  Patient verbalizes understanding of this education and agrees to this plan of care      Psychiatric Specialty Exam: Presentation  General Appearance:  Casual   Eye Contact: Fair   Speech: Clear and Coherent   Speech Volume: Normal       Mood and Affect  Mood: Euthymic   Affect: Congruent     Thought Process  Thought Processes: Coherent   Descriptions of Associations:Intact   Orientation:Full (Time, Place and Person)   Thought Content:Logical   Hallucinations:No data recorded   Ideas of Reference:None   Suicidal Thoughts:No data recorded   Homicidal Thoughts:No data recorded     Sensorium  Memory: good   Judgment: Improved    Insight: Fair     Chartered certified accountant: improved Fair   Attention Span: improved Fair   Recall: improved [employer] of Knowledge: Fair   Language: Fair     Psychomotor Activity  Psychomotor Activity: wnl   Musculoskeletal: Strength & Muscle Tone: within normal limits Gait & Station: normal Assets  Assets: Manufacturing systems engineer; Desire for Improvement   Sleep  Sleep:No data recorded   Physical Exam: Physical Exam ROS Blood pressure (!) 108/53, pulse 86, temperature 97.6 F (36.4 C), resp. rate 20, height 5' (1.524 m), weight 68 kg, last menstrual period 08/29/2023, SpO2 98%. Body mass index is 29.28 kg/m.   Social History   Tobacco Use  Smoking Status Every Day   Current packs/day: 0.10   Types: Cigarettes  Smokeless Tobacco Never   Tobacco Cessation:  N/A, patient does not currently use tobacco products   Blood Alcohol level:  Lab Results  Component Value Date   [facility] <15 09/09/2023   ETH <5 08/12/2016    Metabolic Disorder Labs:  Lab Results  Component Value Date   HGBA1C 4.8 09/12/2023   MPG 91 09/12/2023   MPG 97 06/07/2016   No results found for: PROLACTIN Lab Results  Component  Value Date   CHOL 158 09/12/2023   TRIG 66 09/12/2023   HDL 55 09/12/2023   CHOLHDL 2.9 09/12/2023   VLDL 13 09/12/2023   LDLCALC 90 09/12/2023   LDLCALC 61 06/07/2016    See Psychiatric Specialty Exam and Suicide Risk Assessment completed by Attending Physician prior to discharge.  Discharge destination:  Home  Is patient on multiple antipsychotic therapies at discharge:  No   Has Patient had three or more failed trials of antipsychotic monotherapy by history:  No  Recommended Plan for Multiple Antipsychotic Therapies: NA  Discharge Instructions     Diet - low sodium heart healthy   Complete by: As directed    Increase activity slowly   Complete by: As directed       Allergies as of 09/15/2023   No Known Allergies      Medication  List     TAKE these medications      Indication  ARIPiprazole  10 MG tablet Commonly known as: Abilify  Take 1 tablet (10 mg total) by mouth daily.  Indication: MIXED BIPOLAR AFFECTIVE DISORDER   gabapentin  300 MG capsule Commonly known as: NEURONTIN  Take 2 capsules (600 mg total) by mouth 3 (three) times daily.  Indication: Generalized Anxiety Disorder   traZODone  50 MG tablet Commonly known as: DESYREL  Take 1 tablet (50 mg total) by mouth at bedtime as needed for sleep.  Indication: Trouble Sleeping        Follow-up Information     [facility] Follow up.   Why: Appointment is scheduled for 09/17/2023 at 11AM. Contact information: [address] [place] [address] [phone]                     Signed: Hoy CHRISTELLA Pinal, NP 09/16/2023, 6:00 PM

## 2023-09-20 LAB — CBG MONITORING, ED: Glucose-Capillary: 363 mg/dL — ABNORMAL HIGH (ref 70–99)

## 2023-09-24 ENCOUNTER — Encounter: Payer: Self-pay | Admitting: Family Medicine

## 2023-09-24 ENCOUNTER — Other Ambulatory Visit: Payer: Self-pay

## 2023-09-24 ENCOUNTER — Ambulatory Visit: Payer: MEDICAID | Admitting: Family Medicine

## 2023-09-24 VITALS — BP 123/74 | HR 82 | Resp 16 | Ht 60.0 in | Wt 153.9 lb

## 2023-09-24 DIAGNOSIS — Z7689 Persons encountering health services in other specified circumstances: Secondary | ICD-10-CM

## 2023-09-24 DIAGNOSIS — E66811 Obesity, class 1: Secondary | ICD-10-CM | POA: Diagnosis not present

## 2023-09-24 DIAGNOSIS — F1410 Cocaine abuse, uncomplicated: Secondary | ICD-10-CM

## 2023-09-24 DIAGNOSIS — F3163 Bipolar disorder, current episode mixed, severe, without psychotic features: Secondary | ICD-10-CM

## 2023-09-24 DIAGNOSIS — E6609 Other obesity due to excess calories: Secondary | ICD-10-CM

## 2023-09-24 DIAGNOSIS — Z87820 Personal history of traumatic brain injury: Secondary | ICD-10-CM

## 2023-09-24 DIAGNOSIS — F410 Panic disorder [episodic paroxysmal anxiety] without agoraphobia: Secondary | ICD-10-CM

## 2023-09-24 DIAGNOSIS — Z833 Family history of diabetes mellitus: Secondary | ICD-10-CM

## 2023-09-24 DIAGNOSIS — Z6830 Body mass index (BMI) 30.0-30.9, adult: Secondary | ICD-10-CM

## 2023-09-24 DIAGNOSIS — N920 Excessive and frequent menstruation with regular cycle: Secondary | ICD-10-CM | POA: Diagnosis not present

## 2023-09-24 DIAGNOSIS — R569 Unspecified convulsions: Secondary | ICD-10-CM

## 2023-09-24 DIAGNOSIS — F5104 Psychophysiologic insomnia: Secondary | ICD-10-CM

## 2023-09-24 DIAGNOSIS — Z1159 Encounter for screening for other viral diseases: Secondary | ICD-10-CM

## 2023-09-24 DIAGNOSIS — H65191 Other acute nonsuppurative otitis media, right ear: Secondary | ICD-10-CM | POA: Diagnosis not present

## 2023-09-24 MED ORDER — TRAZODONE HCL 50 MG PO TABS
25.0000 mg | ORAL_TABLET | Freq: Every evening | ORAL | 0 refills | Status: DC | PRN
  Filled 2023-09-24: qty 30, 30d supply, fill #0

## 2023-09-24 MED ORDER — HYDROXYZINE PAMOATE 25 MG PO CAPS
25.0000 mg | ORAL_CAPSULE | Freq: Three times a day (TID) | ORAL | 0 refills | Status: DC | PRN
  Filled 2023-09-24: qty 30, 10d supply, fill #0

## 2023-09-24 MED ORDER — GABAPENTIN 300 MG PO CAPS
600.0000 mg | ORAL_CAPSULE | Freq: Three times a day (TID) | ORAL | 0 refills | Status: DC
  Filled 2023-09-24: qty 180, 30d supply, fill #0

## 2023-09-24 MED ORDER — TOPIRAMATE 25 MG PO TABS
25.0000 mg | ORAL_TABLET | Freq: Every day | ORAL | 0 refills | Status: DC
  Filled 2023-09-24: qty 30, 30d supply, fill #0

## 2023-09-24 MED ORDER — AMOXICILLIN-POT CLAVULANATE 875-125 MG PO TABS
1.0000 | ORAL_TABLET | Freq: Two times a day (BID) | ORAL | 0 refills | Status: DC
  Filled 2023-09-24: qty 20, 10d supply, fill #0

## 2023-09-24 MED ORDER — ARIPIPRAZOLE 10 MG PO TABS
10.0000 mg | ORAL_TABLET | Freq: Every day | ORAL | 0 refills | Status: DC
  Filled 2023-09-24: qty 30, 30d supply, fill #0

## 2023-09-24 NOTE — Progress Notes (Signed)
 New Patient Office Visit  Introduced to nurse practitioner role and practice setting.  All questions answered.  Discussed provider/patient relationship and expectations.  Subjective    Patient ID: Jennifer Woods, female    DOB: [birth year]  Age: 43 y.o. MRN: [id number]  CC:  Chief Complaint  Patient presents with   Establish Care    Last PCP: n/a Last PAP: in November in Prison (abnormal) Concerns: has been [facility], needs help with mentality. Discuss Depression/Anxiety and needs help to not go into drug addiction.   HPI  Discussed the use of AI scribe software for clinical note transcription with the patient, who gave verbal consent to proceed. History of Present Illness Jennifer Woods is a 43 year old female with epilepsy and substance use disorder who presents to establish a PCP.  She experiences significant insomnia characterized by an inability to sleep and racing thoughts. Trazodone  was initially effective but does not feel it helps. She feels restless and stressed, which exacerbates her mental health concerns.  She has a history of substance use disorder, primarily with cocaine, and relapsed three days ago. She occasionally uses Xanax. Endorses has bought from street a few weeks ago. She wishes to avoid benzodiazepines due to her addiction history.  She has epilepsy and takes gabapentin  600 mg three times daily for seizure management, used to take dilantin . She states she believes she had a seizure last night, feeling stuck in one place. She did not seek care.' States she has had multiple head injuries, including 14 concussions, and frequently suffers from migraines.  Her mental health history includes past auditory hallucinations and suicide attempts, including an overdose on Xanax and Paxil. She reports experiencing auditory hallucinations following her son's death in Oct 24, 2002.  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  She also reports heavy and painful menstrual periods, with a history of a ruptured ovarian cyst and subsequent oophorectomy R sided.  Socially, she has experienced housing instability, but has supportive partner and sister. She previously worked as a Psychologist, occupational and wishes to return to work.  Outpatient Encounter Medications as of 09/24/2023  Medication Sig   amoxicillin -clavulanate (AUGMENTIN ) 875-125 MG tablet Take 1 tablet by mouth 2 (two) times daily.   hydrOXYzine  (VISTARIL ) 25 MG capsule Take 1 capsule (25 mg total) by mouth every 8 (eight) hours as needed.   topiramate  (TOPAMAX ) 25 MG tablet Take 1 tablet (25 mg total) by mouth daily.   traZODone  (DESYREL ) 50 MG tablet Take 0.5-1 tablets (25-50 mg total) by mouth at bedtime as needed for sleep.   [DISCONTINUED] ARIPiprazole  (ABILIFY ) 10 MG tablet Take 1 tablet (10 mg total) by mouth daily.   [DISCONTINUED] gabapentin  (NEURONTIN ) 300 MG capsule Take 2 capsules (600 mg total) by mouth 3 (three) times daily.   [DISCONTINUED] traZODone  (DESYREL ) 50 MG tablet Take 1 tablet (50 mg total) by mouth at bedtime as needed for sleep.   ARIPiprazole  (ABILIFY ) 10 MG tablet Take 1 tablet (10 mg total) by mouth daily.   gabapentin  (NEURONTIN ) 300 MG capsule Take 2 capsules (600 mg total) by mouth 3 (three) times daily.   No facility-administered encounter medications on file as of 09/24/2023.    Past Medical History:  Diagnosis Date   ADHD    Allergy    Anxiety    Autistic disorder    Bipolar 1 disorder (HCC)    Depression    Seizures (HCC)  Past Surgical History:  Procedure Laterality Date   CESAREAN SECTION     LEFT OOPHORECTOMY      Family History  Problem Relation Age of Onset   Pancreatic cancer Mother    Diverticulitis Mother    Diabetes Father    Heart disease Father    Gout Father    Thyroid disease Brother    Epilepsy Brother    Lung cancer Maternal Grandmother    Mental illness Paternal Grandfather    Diabetes Paternal  Grandfather     Social History   Socioeconomic History   Marital status: Single    Spouse name: Not on file   Number of children: Not on file   Years of education: Not on file   Highest education level: Not on file  Occupational History   Not on file  Tobacco Use   Smoking status: Every Day    Current packs/day: 0.10    Types: Cigarettes   Smokeless tobacco: Never  Vaping Use   Vaping status: Never Used  Substance and Sexual Activity   Alcohol use: No   Drug use: Yes    Types: Marijuana, Cocaine, Oxycodone   Sexual activity: Yes  Other Topics Concern   Not on file  Social History Narrative   Not on file   Social Drivers of Health   Financial Resource Strain: High Risk (09/03/2023)   Received from [facility]   Overall Financial Resource Strain (CARDIA)    Difficulty of Paying Living Expenses: Very hard  Food Insecurity: No Food Insecurity (09/11/2023)   Hunger Vital Sign    Worried About Running Out of Food in the Last Year: Never true    Ran Out of Food in the Last Year: Never true  Recent Concern: Food Insecurity - Food Insecurity Present (09/03/2023)   Received from [facility]   Hunger Vital Sign    Within the past 12 months, you worried that your food would run out before you got the money to buy more.: Sometimes true    Within the past 12 months, the food you bought just didn't last and you didn't have money to get more.: Sometimes true  Transportation Needs: No Transportation Needs (09/11/2023)   PRAPARE - Administrator, Civil Service (Medical): No    Lack of Transportation (Non-Medical): No  Recent Concern: Transportation Needs - Unmet Transportation Needs (09/03/2023)   Received from [facility] - Transportation    In the past 12 months, has lack of transportation kept you from medical appointments or from getting medications?: Yes    Lack of Transportation (Non-Medical): Yes  Physical  Activity: Not on file  Stress: Not on file  Social Connections: Not on file  Intimate Partner Violence: Not At Risk (09/11/2023)   Humiliation, Afraid, Rape, and Kick questionnaire    Fear of Current or Ex-Partner: No    Emotionally Abused: No    Physically Abused: No    Sexually Abused: No    ROS      Objective    BP 123/74 (BP Location: Left Arm, Patient Position: Sitting, Cuff Size: Normal)   Pulse 82   Resp 16   Ht 5' (1.524 m)   Wt 153 lb 14.4 oz (69.8 kg)   LMP 08/29/2023   SpO2 99%   BMI 30.06 kg/m   Physical Exam Constitutional:      General: She is not in acute distress.    Appearance: Normal  appearance. She is normal weight. She is not ill-appearing, toxic-appearing or diaphoretic.  HENT:     Head: Normocephalic.     Nose: Nose normal.     Mouth/Throat:     Mouth: Mucous membranes are moist.     Dentition: Abnormal dentition. Dental caries present.     Pharynx: Oropharynx is clear.  Eyes:     Extraocular Movements: Extraocular movements intact.     Pupils: Pupils are equal, round, and reactive to light.  Cardiovascular:     Rate and Rhythm: Normal rate and regular rhythm.     Pulses: Normal pulses.     Heart sounds: Normal heart sounds. No murmur heard.    No friction rub. No gallop.  Pulmonary:     Effort: No respiratory distress.     Breath sounds: No stridor. No wheezing, rhonchi or rales.  Chest:     Chest wall: No tenderness.  Musculoskeletal:     Right lower leg: No edema.     Left lower leg: No edema.  Skin:    General: Skin is warm and dry.     Capillary Refill: Capillary refill takes less than 2 seconds.  Neurological:     General: No focal deficit present.     Mental Status: She is alert and oriented to person, place, and time. Mental status is at baseline.  Psychiatric:        Mood and Affect: Mood is anxious. Affect is labile.        Speech: Speech is rapid and pressured and tangential.        Behavior: Behavior is hyperactive.  Behavior is cooperative.        Thought Content: Thought content is paranoid.        Judgment: Judgment normal.         Assessment & Plan:  Assessment and Plan Assessment & Plan Cocaine use disorder Cocaine use disorder with recent relapse three days ago. She expresses a desire to stop cocaine use and is seeking help. Topamax  was discussed for its benefits in cocaine use disorder and migraines. . - Start Topamax  25 mg for cocaine use disorder, given hx use for her migraines - Stressed importance for cocaine cessation, risks, and limiting access to substances - she is motivated wants help  Epilepsy, with HX of concussions/TBIs - per patient Pt reports recent  seizure, unwitnessed. She is currently on gabapentin  for seizure control per patient. Reports multiple head injuries and a history of concussions (x14 - per patient). No current neurologist. - Refer to neurology for epilepsy management - Continue gabapentin  until neurology consultation  Major depressive disorder, bipolar Major depressive disorder with auditory hallucinations and past suicide attempts. She is currently on Abilify  and expresses significant stress and anxiety, with racing thoughts and difficulty sitting still.  - Continue Abilify  10mg  daily, will order 30 day supply until pt connects with psychiatrist - Refer to mental health specialist for further evaluation and management  Panic Attacks - Will order hydrozyzine 25 mg prn every 8 hours - until pt can gain access to a mental health provider  Insomnia Insomnia with difficulty maintaining sleep. Currently on trazodone . Risks of increasing trazodone  dose were discussed given her current mental health status. - Continue trazodone  25-50mg  prn for insomnia - Advise against use of cocaine, street benzos - Sleep hygiene and schedule stressed  BMI = 30.06 Continue to make conscious decisions for well balanced diet smaller portions with increase protein, fruits,  veggies, water as drink of choice,  decrease starches, processed foods, and saturated fats. Increase weekly exercise - 150 minutes per week.  - Will check A1C, given family diabetes hx - CMP and lipids  Migraine Migraines possibly related to multiple head injuries.  Previously used Topamax  for prevention, given complicated issue of cocaine use, will restart. - Start Topamax  for migraine prophylaxis  Right otitis media/ and externa - ear pain and redness. Mid ear effusion, purulent Possible ear infection on examination. - augmentin  BID for 10 days - Consider ENT referral if symptoms persist  Menorrhagia Menorrhagia with prolonged and heavy menstrual bleeding.  History of ovarian cyst rupture and unilateral oophorectomy, chart review states total hysterectomy, pt denies. States still has period, has uterus and cervix.  She expresses interest in hysterectomy due to severe symptoms. - Refer to OB/GYN for evaluation of menorrhagia and potential hysterectomy   Cocaine use disorder (HCC) -     Vitamin B12 -     VITAMIN D  25 Hydroxy (Vit-D Deficiency, Fractures) -     Topiramate ; Take 1 tablet (25 mg total) by mouth daily.  Dispense: 30 tablet; Refill: 0  Class 1 obesity due to excess calories without serious comorbidity with body mass index (BMI) of 30.0 to 30.9 in adult -     Basic metabolic panel with GFR -     CBC -     Hemoglobin A1c -     Lipid panel  Menorrhagia with regular cycle  Acute mucoid otitis media of right ear -     Amoxicillin -Pot Clavulanate; Take 1 tablet by mouth 2 (two) times daily.  Dispense: 20 tablet; Refill: 0  Seizures (HCC) -     Gabapentin ; Take 2 capsules (600 mg total) by mouth 3 (three) times daily.  Dispense: 180 capsule; Refill: 0  Bipolar 1 disorder, mixed, severe (HCC) -     ARIPiprazole ; Take 1 tablet (10 mg total) by mouth daily.  Dispense: 30 tablet; Refill: 0  Panic attacks -     hydrOXYzine  Pamoate; Take 1 capsule (25 mg total) by mouth  every 8 (eight) hours as needed.  Dispense: 30 capsule; Refill: 0 -     Topiramate ; Take 1 tablet (25 mg total) by mouth daily.  Dispense: 30 tablet; Refill: 0  Psychophysiological insomnia  Encounter to establish care with new doctor  Family history of diabetes mellitus -     Hemoglobin A1c  Screening for viral disease -     Hepatitis C antibody -     HIV Antibody (routine testing w rflx)  History of traumatic brain injury -     traZODone  HCl; Take 0.5-1 tablets (25-50 mg total) by mouth at bedtime as needed for sleep.  Dispense: 30 tablet; Refill: 0    Return in about 3 months (around 12/25/2023) for chronic diseast.   I, Curtis DELENA Boom, FNP, have reviewed all documentation for this visit. The documentation on 09/25/23 for the exam, diagnosis, procedures, and orders are all accurate and complete.   Curtis DELENA Boom, FNP

## 2023-09-25 ENCOUNTER — Ambulatory Visit: Payer: Self-pay | Admitting: Family Medicine

## 2023-09-25 ENCOUNTER — Encounter: Payer: Self-pay | Admitting: Family Medicine

## 2023-09-28 LAB — BASIC METABOLIC PANEL WITH GFR
BUN/Creatinine Ratio: 18 (ref 9–23)
BUN: 13 mg/dL (ref 6–24)
CO2: 19 mmol/L — ABNORMAL LOW (ref 20–29)
Calcium: 9.9 mg/dL (ref 8.7–10.2)
Chloride: 104 mmol/L (ref 96–106)
Creatinine, Ser: 0.73 mg/dL (ref 0.57–1.00)
Glucose: 89 mg/dL (ref 70–99)
Potassium: 4.1 mmol/L (ref 3.5–5.2)
Sodium: 142 mmol/L (ref 134–144)
eGFR: 105 mL/min/1.73 (ref >59)

## 2023-09-28 LAB — CBC
Hematocrit: 41.4 % (ref 34.0–46.6)
Hemoglobin: 13.8 g/dL (ref 11.1–15.9)
MCH: 30.4 pg (ref 26.6–33.0)
MCHC: 33.3 g/dL (ref 31.5–35.7)
MCV: 91 fL (ref 79–97)
Platelets: 301 x10E3/uL (ref 150–450)
RBC: 4.54 x10E6/uL (ref 3.77–5.28)
RDW: 12.9 % (ref 11.7–15.4)
WBC: 5.3 x10E3/uL (ref 3.4–10.8)

## 2023-09-28 LAB — HEMOGLOBIN A1C
Est. average glucose Bld gHb Est-mCnc: 88 mg/dL
Hgb A1c MFr Bld: 4.7 % — ABNORMAL LOW (ref 4.8–5.6)

## 2023-09-28 LAB — HIV ANTIBODY (ROUTINE TESTING W REFLEX): HIV Screen 4th Generation wRfx: NONREACTIVE

## 2023-09-28 LAB — HEPATITIS C ANTIBODY: Hep C Virus Ab: NONREACTIVE

## 2023-09-28 LAB — LIPID PANEL
Chol/HDL Ratio: 2.5 ratio (ref 0.0–4.4)
Cholesterol, Total: 179 mg/dL (ref 100–199)
HDL: 71 mg/dL (ref >39)
LDL Chol Calc (NIH): 98 mg/dL (ref 0–99)
Triglycerides: 50 mg/dL (ref 0–149)
VLDL Cholesterol Cal: 10 mg/dL (ref 5–40)

## 2023-09-28 LAB — VITAMIN D 25 HYDROXY (VIT D DEFICIENCY, FRACTURES): Vit D, 25-Hydroxy: 30.7 ng/mL (ref 30.0–100.0)

## 2023-09-28 LAB — VITAMIN B12: Vitamin B-12: 961 pg/mL (ref 232–1245)

## 2023-10-04 ENCOUNTER — Encounter: Payer: Self-pay | Admitting: Neurology

## 2023-11-09 NOTE — Progress Notes (Deleted)
 Wellington Curtis LABOR, FNP   No chief complaint on file.   HPI:      Jennifer Woods is a 43 y.o. No obstetric history on file. whose LMP was No LMP recorded., presents today for ***     Patient Active Problem List   Diagnosis Date Noted   Substance abuse (HCC) 09/11/2023   Bipolar 1 disorder, depressed, moderate (HCC) 08/12/2016   Sedative, hypnotic or anxiolytic use disorder, severe, dependence (HCC) 06/07/2016   Cannabis use disorder, moderate, dependence (HCC) 06/07/2016   Bipolar 1 disorder, mixed, severe (HCC) 06/06/2016   Tobacco use disorder 06/06/2016   Seizures (HCC) 06/05/2016   Cocaine abuse (HCC) 06/05/2016   Noncompliance 06/05/2016    Past Surgical History:  Procedure Laterality Date   CESAREAN SECTION     LEFT OOPHORECTOMY      Family History  Problem Relation Age of Onset   Pancreatic cancer Mother    Diverticulitis Mother    Diabetes Father    Heart disease Father    Gout Father    Thyroid disease Brother    Epilepsy Brother    Lung cancer Maternal Grandmother    Mental illness Paternal Grandfather    Diabetes Paternal Grandfather     Social History   Socioeconomic History   Marital status: Single    Spouse name: Not on file   Number of children: Not on file   Years of education: Not on file   Highest education level: Not on file  Occupational History   Not on file  Tobacco Use   Smoking status: Every Day    Current packs/day: 0.10    Types: Cigarettes   Smokeless tobacco: Never  Vaping Use   Vaping status: Never Used  Substance and Sexual Activity   Alcohol use: No   Drug use: Yes    Types: Marijuana, Cocaine, Oxycodone   Sexual activity: Yes  Other Topics Concern   Not on file  Social History Narrative   Not on file   Social Drivers of Health   Financial Resource Strain: High Risk (09/03/2023)   Received from [facility]   Overall Financial Resource Strain (CARDIA)    Difficulty of Paying Living  Expenses: Very hard  Food Insecurity: No Food Insecurity (09/11/2023)   Hunger Vital Sign    Worried About Running Out of Food in the Last Year: Never true    Ran Out of Food in the Last Year: Never true  Recent Concern: Food Insecurity - Food Insecurity Present (09/03/2023)   Received from [facility]   Hunger Vital Sign    Within the past 12 months, you worried that your food would run out before you got the money to buy more.: Sometimes true    Within the past 12 months, the food you bought just didn't last and you didn't have money to get more.: Sometimes true  Transportation Needs: No Transportation Needs (09/11/2023)   PRAPARE - Administrator, Civil Service (Medical): No    Lack of Transportation (Non-Medical): No  Recent Concern: Transportation Needs - Unmet Transportation Needs (09/03/2023)   Received from [facility] - Transportation    In the past 12 months, has lack of transportation kept you from medical appointments or from getting medications?: Yes    Lack of Transportation (Non-Medical): Yes  Physical Activity: Not on file  Stress: Not on file  Social Connections: Not on file  Intimate  Partner Violence: Not At Risk (09/11/2023)   Humiliation, Afraid, Rape, and Kick questionnaire    Fear of Current or Ex-Partner: No    Emotionally Abused: No    Physically Abused: No    Sexually Abused: No    Outpatient Medications Prior to Visit  Medication Sig Dispense Refill   amoxicillin -clavulanate (AUGMENTIN ) 875-125 MG tablet Take 1 tablet by mouth 2 (two) times daily. 20 tablet 0   ARIPiprazole  (ABILIFY ) 10 MG tablet Take 1 tablet (10 mg total) by mouth daily. 30 tablet 0   gabapentin  (NEURONTIN ) 300 MG capsule Take 2 capsules (600 mg total) by mouth 3 (three) times daily. 180 capsule 0   hydrOXYzine  (VISTARIL ) 25 MG capsule Take 1 capsule (25 mg total) by mouth every 8 (eight) hours as needed. 30 capsule 0   topiramate   (TOPAMAX ) 25 MG tablet Take 1 tablet (25 mg total) by mouth daily. 30 tablet 0   traZODone  (DESYREL ) 50 MG tablet Take 0.5-1 tablets (25-50 mg total) by mouth at bedtime as needed for sleep. 30 tablet 0   No facility-administered medications prior to visit.      ROS:  Review of Systems   OBJECTIVE:   Vitals:  There were no vitals taken for this visit.  Physical Exam  Results: No results found for this or any previous visit (from the past 24 hours).   Assessment/Plan: There are no diagnoses linked to this encounter.    No orders of the defined types were placed in this encounter.    Mathis LITTIE Getting, CMA 11/09/2023 11:48 AM

## 2023-11-11 ENCOUNTER — Encounter: Payer: MEDICAID | Admitting: Certified Nurse Midwife

## 2023-11-11 DIAGNOSIS — N921 Excessive and frequent menstruation with irregular cycle: Secondary | ICD-10-CM

## 2023-12-07 ENCOUNTER — Ambulatory Visit: Payer: MEDICAID | Admitting: Neurology

## 2023-12-07 ENCOUNTER — Encounter: Payer: Self-pay | Admitting: Neurology

## 2023-12-18 ENCOUNTER — Emergency Department
Admission: EM | Admit: 2023-12-18 | Discharge: 2023-12-18 | Disposition: A | Discharge status: Home / Self Care | Payer: MEDICAID | Attending: Emergency Medicine | Admitting: Emergency Medicine

## 2023-12-18 ENCOUNTER — Other Ambulatory Visit: Payer: Self-pay

## 2023-12-18 DIAGNOSIS — Z760 Encounter for issue of repeat prescription: Secondary | ICD-10-CM | POA: Insufficient documentation

## 2023-12-18 MED ORDER — GABAPENTIN 300 MG PO CAPS
600.0000 mg | ORAL_CAPSULE | Freq: Three times a day (TID) | ORAL | 0 refills | Status: DC
  Filled 2023-12-18: qty 90, 15d supply, fill #0

## 2023-12-18 MED ORDER — GABAPENTIN 300 MG PO CAPS
600.0000 mg | ORAL_CAPSULE | Freq: Once | ORAL | Status: AC
  Administered 2023-12-18: 600 mg via ORAL
  Filled 2023-12-18: qty 2

## 2023-12-18 NOTE — ED Provider Notes (Signed)
 [facility] AT [facility] Provider Note   CSN: [id number] Arrival date & time: 12/18/23  9079     Patient presents with: Medication Refill   Jennifer Woods is a 43 y.o. female presents to the emergency department for evaluation of medication refill.  She is requesting refill of gabapentin  600 mg 3 times daily.  She takes this for seizure supplement.  She has a history of seizures, cocaine abuse, bipolar disorder.  Patient states she has been without the medicine for 1 week.  No recent seizures.  Previous notes reviewed showing and confirming gabapentin  600 mg 3 times daily.    Prior to Admission medications   Medication Sig Start Date End Date Taking? Authorizing Provider  gabapentin  (NEURONTIN ) 300 MG capsule Take 2 capsules (600 mg total) by mouth 3 (three) times daily. 12/18/23  Yes Charlene Debby BROCKS, PA-C  amoxicillin -clavulanate (AUGMENTIN ) 875-125 MG tablet Take 1 tablet by mouth 2 (two) times daily. 09/24/23   Clifton, Kellie A, FNP  ARIPiprazole  (ABILIFY ) 10 MG tablet Take 1 tablet (10 mg total) by mouth daily. 09/24/23   Clifton, Kellie A, FNP  hydrOXYzine  (VISTARIL ) 25 MG capsule Take 1 capsule (25 mg total) by mouth every 8 (eight) hours as needed. 09/24/23   Clifton, Kellie A, FNP  topiramate  (TOPAMAX ) 25 MG tablet Take 1 tablet (25 mg total) by mouth daily. 09/24/23   Clifton, Kellie A, FNP  traZODone  (DESYREL ) 50 MG tablet Take 0.5-1 tablets (25-50 mg total) by mouth at bedtime as needed for sleep. 09/24/23   Clifton, Kellie A, FNP    Allergies: Patient has no known allergies.    Review of Systems  Updated Vital Signs BP (!) 145/96 (BP Location: Right Arm)   Pulse 94   Temp 97.9 F (36.6 C) (Oral)   Resp 20   Ht 5' (1.524 m)   Wt 68 kg   SpO2 100%   BMI 29.29 kg/m   Physical Exam Constitutional:      Appearance: She is well-developed.  HENT:     Head: Normocephalic and atraumatic.  Eyes:     Extraocular Movements: Extraocular  movements intact.     Conjunctiva/sclera: Conjunctivae normal.     Pupils: Pupils are equal, round, and reactive to light.  Cardiovascular:     Rate and Rhythm: Normal rate.  Pulmonary:     Effort: Pulmonary effort is normal. No respiratory distress.  Musculoskeletal:        General: Normal range of motion.     Cervical back: Normal range of motion.  Skin:    General: Skin is warm.     Findings: No rash.  Neurological:     General: No focal deficit present.     Mental Status: She is alert and oriented to person, place, and time. Mental status is at baseline.     Gait: Gait normal.  Psychiatric:        Mood and Affect: Mood normal.        Behavior: Behavior normal.        Thought Content: Thought content normal.        Judgment: Judgment normal.     (all labs ordered are listed, but only abnormal results are displayed) Labs Reviewed - No data to display  EKG: None  Radiology: No results found.   Procedures   Medications Ordered in the ED  gabapentin  (NEURONTIN ) capsule 600 mg (has no administration in time range)  Medical Decision Making Risk Prescription drug management.   43 year old female with presentation to the Emergency Department for medication refill.  She has been out of gabapentin  for 1 week and would like a refill of this medication.  She takes this for seizure support.  She is on 600 mg 3 times a day and this was confirmed in previous notes.  Patient was given 600 mg of Neurontin  today and will start 600 mg 3 times daily.  Prescription was sent to [facility] pharmacy.  She appears well and understands signs symptoms return to the ER for.  Final diagnoses:  Medication refill    ED Discharge Orders          Ordered    gabapentin  (NEURONTIN ) 300 MG capsule  3 times daily        12/18/23 0950               Charlene Debby BROCKS, PA-C 12/18/23 9043    Arlander Charleston, MD 12/18/23 1041

## 2023-12-18 NOTE — ED Triage Notes (Signed)
 Pt to ED for medication refill. Pt needs gabapentin  refill. States has been off for 1.5 week and was recently admitted in BMU.

## 2023-12-18 NOTE — Discharge Instructions (Signed)
 Please take the occasion as prescribed and return to the ER for any worsening symptoms or any urgent changes in your health.  Please keep your neurology appointment for this month.

## 2023-12-24 ENCOUNTER — Ambulatory Visit: Payer: MEDICAID | Admitting: Family Medicine

## 2023-12-28 ENCOUNTER — Ambulatory Visit: Payer: MEDICAID | Admitting: Family Medicine

## 2023-12-29 ENCOUNTER — Ambulatory Visit: Payer: MEDICAID | Admitting: Family Medicine

## 2023-12-29 ENCOUNTER — Encounter: Payer: Self-pay | Admitting: Family Medicine

## 2023-12-29 ENCOUNTER — Other Ambulatory Visit: Payer: Self-pay

## 2023-12-29 VITALS — BP 149/83 | HR 107 | Ht 60.0 in | Wt 146.1 lb

## 2023-12-29 DIAGNOSIS — F3163 Bipolar disorder, current episode mixed, severe, without psychotic features: Secondary | ICD-10-CM

## 2023-12-29 DIAGNOSIS — F5104 Psychophysiologic insomnia: Secondary | ICD-10-CM

## 2023-12-29 DIAGNOSIS — R569 Unspecified convulsions: Secondary | ICD-10-CM

## 2023-12-29 DIAGNOSIS — F1410 Cocaine abuse, uncomplicated: Secondary | ICD-10-CM | POA: Diagnosis not present

## 2023-12-29 DIAGNOSIS — N920 Excessive and frequent menstruation with regular cycle: Secondary | ICD-10-CM

## 2023-12-29 DIAGNOSIS — F410 Panic disorder [episodic paroxysmal anxiety] without agoraphobia: Secondary | ICD-10-CM | POA: Diagnosis not present

## 2023-12-29 DIAGNOSIS — Z87820 Personal history of traumatic brain injury: Secondary | ICD-10-CM

## 2023-12-29 DIAGNOSIS — F1910 Other psychoactive substance abuse, uncomplicated: Secondary | ICD-10-CM

## 2023-12-29 MED ORDER — ARIPIPRAZOLE 10 MG PO TABS
10.0000 mg | ORAL_TABLET | Freq: Every day | ORAL | 1 refills | Status: DC
  Filled 2023-12-29: qty 30, 30d supply, fill #0

## 2023-12-29 MED ORDER — TOPIRAMATE 25 MG PO TABS
25.0000 mg | ORAL_TABLET | Freq: Every day | ORAL | 1 refills | Status: DC
  Filled 2023-12-29: qty 30, 30d supply, fill #0

## 2023-12-29 MED ORDER — HYDROXYZINE PAMOATE 25 MG PO CAPS
25.0000 mg | ORAL_CAPSULE | Freq: Three times a day (TID) | ORAL | 1 refills | Status: DC | PRN
  Filled 2023-12-29: qty 30, 10d supply, fill #0

## 2023-12-29 MED ORDER — GABAPENTIN 300 MG PO CAPS
600.0000 mg | ORAL_CAPSULE | Freq: Three times a day (TID) | ORAL | 1 refills | Status: DC
  Filled 2023-12-29: qty 180, 30d supply, fill #0

## 2023-12-29 MED ORDER — TRAZODONE HCL 50 MG PO TABS
25.0000 mg | ORAL_TABLET | Freq: Every evening | ORAL | 1 refills | Status: DC | PRN
  Filled 2023-12-29: qty 30, 30d supply, fill #0
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
## 2023-12-29 NOTE — Patient Instructions (Addendum)
 [facility] [address] [place], [address] [place], [place]  [address] Phone:  [phone]   Fax:  [phone]  [facility] Phone: [phone]

## 2023-12-30 ENCOUNTER — Encounter: Payer: Self-pay | Admitting: Family Medicine

## 2023-12-30 DIAGNOSIS — F410 Panic disorder [episodic paroxysmal anxiety] without agoraphobia: Secondary | ICD-10-CM | POA: Insufficient documentation

## 2023-12-30 DIAGNOSIS — F5104 Psychophysiologic insomnia: Secondary | ICD-10-CM | POA: Insufficient documentation

## 2023-12-30 DIAGNOSIS — N920 Excessive and frequent menstruation with regular cycle: Secondary | ICD-10-CM | POA: Insufficient documentation

## 2024-01-11 ENCOUNTER — Emergency Department: Admission: EM | Admit: 2024-01-11 | Discharge: 2024-01-12 | Disposition: A | Payer: MEDICAID

## 2024-01-11 DIAGNOSIS — R569 Unspecified convulsions: Secondary | ICD-10-CM

## 2024-01-11 DIAGNOSIS — F319 Bipolar disorder, unspecified: Secondary | ICD-10-CM | POA: Diagnosis not present

## 2024-01-11 DIAGNOSIS — R443 Hallucinations, unspecified: Secondary | ICD-10-CM

## 2024-01-11 DIAGNOSIS — G4700 Insomnia, unspecified: Secondary | ICD-10-CM | POA: Insufficient documentation

## 2024-01-11 DIAGNOSIS — F29 Unspecified psychosis not due to a substance or known physiological condition: Secondary | ICD-10-CM | POA: Insufficient documentation

## 2024-01-11 DIAGNOSIS — F1914 Other psychoactive substance abuse with psychoactive substance-induced mood disorder: Secondary | ICD-10-CM | POA: Insufficient documentation

## 2024-01-11 DIAGNOSIS — F410 Panic disorder [episodic paroxysmal anxiety] without agoraphobia: Secondary | ICD-10-CM

## 2024-01-11 DIAGNOSIS — F419 Anxiety disorder, unspecified: Secondary | ICD-10-CM | POA: Insufficient documentation

## 2024-01-11 DIAGNOSIS — F4310 Post-traumatic stress disorder, unspecified: Secondary | ICD-10-CM | POA: Insufficient documentation

## 2024-01-11 DIAGNOSIS — F5104 Psychophysiologic insomnia: Secondary | ICD-10-CM

## 2024-01-11 DIAGNOSIS — F1414 Cocaine abuse with cocaine-induced mood disorder: Secondary | ICD-10-CM | POA: Diagnosis present

## 2024-01-11 DIAGNOSIS — F3163 Bipolar disorder, current episode mixed, severe, without psychotic features: Secondary | ICD-10-CM

## 2024-01-11 DIAGNOSIS — F840 Autistic disorder: Secondary | ICD-10-CM | POA: Insufficient documentation

## 2024-01-11 DIAGNOSIS — F1410 Cocaine abuse, uncomplicated: Secondary | ICD-10-CM

## 2024-01-11 LAB — ETHANOL: Alcohol, Ethyl (B): <15 mg/dL (ref <15)

## 2024-01-11 LAB — CBC WITH DIFFERENTIAL/PLATELET
Abs Immature Granulocytes: 0.01 K/uL (ref 0.00–0.07)
Basophils Absolute: 0.1 K/uL (ref 0.0–0.1)
Basophils Relative: 1 %
Eosinophils Absolute: 0 K/uL (ref 0.0–0.5)
Eosinophils Relative: 1 %
HCT: 40 % (ref 36.0–46.0)
Hemoglobin: 13.3 g/dL (ref 12.0–15.0)
Immature Granulocytes: 0 %
Lymphocytes Relative: 22 %
Lymphs Abs: 1.1 K/uL (ref 0.7–4.0)
MCH: 30.1 pg (ref 26.0–34.0)
MCHC: 33.3 g/dL (ref 30.0–36.0)
MCV: 90.5 fL (ref 80.0–100.0)
Monocytes Absolute: 0.3 K/uL (ref 0.1–1.0)
Monocytes Relative: 7 %
Neutro Abs: 3.6 K/uL (ref 1.7–7.7)
Neutrophils Relative %: 69 %
Platelets: 307 K/uL (ref 150–400)
RBC: 4.42 MIL/uL (ref 3.87–5.11)
RDW: 11.9 % (ref 11.5–15.5)
WBC: 5.2 K/uL (ref 4.0–10.5)
nRBC: 0 % (ref 0.0–0.2)

## 2024-01-11 LAB — BASIC METABOLIC PANEL WITH GFR
Anion gap: 12 (ref 5–15)
BUN: 11 mg/dL (ref 6–20)
CO2: 23 mmol/L (ref 22–32)
Calcium: 9.2 mg/dL (ref 8.9–10.3)
Chloride: 102 mmol/L (ref 98–111)
Creatinine, Ser: 0.58 mg/dL (ref 0.44–1.00)
GFR, Estimated: >60 mL/min (ref >60)
Glucose, Bld: 110 mg/dL — ABNORMAL HIGH (ref 70–99)
Potassium: 3.6 mmol/L (ref 3.5–5.1)
Sodium: 137 mmol/L (ref 135–145)

## 2024-01-11 LAB — URINE DRUG SCREEN
Amphetamines: NEGATIVE
Barbiturates: NEGATIVE
Benzodiazepines: NEGATIVE
Cocaine: POSITIVE — AB
Fentanyl: NEGATIVE
Methadone Scn, Ur: NEGATIVE
Opiates: NEGATIVE
Tetrahydrocannabinol: POSITIVE — AB

## 2024-01-11 LAB — ACETAMINOPHEN LEVEL: Acetaminophen (Tylenol), Serum: <10 ug/mL — ABNORMAL LOW (ref 10–30)

## 2024-01-11 LAB — POC URINE PREG, ED: Preg Test, Ur: NEGATIVE

## 2024-01-11 LAB — SALICYLATE LEVEL: Salicylate Lvl: <7 mg/dL — ABNORMAL LOW (ref 7.0–30.0)

## 2024-01-11 MED ORDER — GABAPENTIN 300 MG PO CAPS
600.0000 mg | ORAL_CAPSULE | Freq: Three times a day (TID) | ORAL | Status: DC
  Administered 2024-01-11 – 2024-01-12 (×4): 600 mg via ORAL
  Filled 2024-01-11 (×4): qty 2

## 2024-01-11 NOTE — ED Notes (Signed)
 This nt and RN Leonor dressed pt out. 1 pair of blue jeans 1 pair of black underwear 1 pink bra I brown small back pack 1 vape 1green sweater 1 pair of blue sock 1 purple duffle bag 1 teal suitcase.

## 2024-01-11 NOTE — ED Notes (Signed)

## 2024-01-11 NOTE — ED Notes (Signed)
Snacks given to pt.

## 2024-01-11 NOTE — ED Notes (Signed)
 Dinner tray provided to pt

## 2024-01-11 NOTE — BH Assessment (Signed)
 IRIS consult has been placed for patient to be seen.
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
## 2024-01-12 ENCOUNTER — Other Ambulatory Visit: Payer: Self-pay

## 2024-01-12 MED ORDER — HYDROXYZINE PAMOATE 25 MG PO CAPS
25.0000 mg | ORAL_CAPSULE | Freq: Three times a day (TID) | ORAL | 1 refills | Status: AC | PRN
  Filled 2024-01-12: qty 30, 10d supply, fill #0
  Filled 2024-02-11: qty 30, 10d supply, fill #1

## 2024-01-12 MED ORDER — TOPIRAMATE 25 MG PO TABS
25.0000 mg | ORAL_TABLET | Freq: Every day | ORAL | 1 refills | Status: AC
  Filled 2024-01-12 – 2024-02-11 (×2): qty 30, 30d supply, fill #0

## 2024-01-12 MED ORDER — ARIPIPRAZOLE 10 MG PO TABS
10.0000 mg | ORAL_TABLET | Freq: Every day | ORAL | 1 refills | Status: AC
  Filled 2024-01-12 – 2024-02-11 (×2): qty 30, 30d supply, fill #0

## 2024-01-12 MED ORDER — TRAZODONE HCL 50 MG PO TABS
25.0000 mg | ORAL_TABLET | Freq: Every evening | ORAL | 1 refills | Status: DC | PRN
  Filled 2024-01-12: qty 30, 30d supply, fill #0

## 2024-01-12 MED ORDER — TOPIRAMATE 25 MG PO TABS
25.0000 mg | ORAL_TABLET | Freq: Every day | ORAL | Status: DC
  Administered 2024-01-12: 25 mg via ORAL
  Filled 2024-01-12: qty 1

## 2024-01-12 MED ORDER — TRAZODONE HCL 50 MG PO TABS: 50.0000 mg | ORAL_TABLET | Freq: Every evening | ORAL | Status: DC | PRN

## 2024-01-12 MED ORDER — GABAPENTIN 300 MG PO CAPS
600.0000 mg | ORAL_CAPSULE | Freq: Three times a day (TID) | ORAL | 1 refills | Status: AC
  Filled 2024-01-12: qty 180, 30d supply, fill #0
  Filled 2024-02-11: qty 180, 30d supply, fill #1

## 2024-01-12 MED ORDER — ARIPIPRAZOLE 10 MG PO TABS
10.0000 mg | ORAL_TABLET | Freq: Every day | ORAL | Status: DC
  Administered 2024-01-12: 10 mg via ORAL
  Filled 2024-01-12: qty 1

## 2024-01-12 MED ORDER — MIRTAZAPINE 15 MG PO TABS
15.0000 mg | ORAL_TABLET | Freq: Every day | ORAL | 2 refills | Status: AC
  Filled 2024-01-12: qty 30, 30d supply, fill #0
  Filled 2024-02-11: qty 30, 30d supply, fill #1

## 2024-01-12 NOTE — BH Assessment (Signed)
 Writer spoke with [facility] May  Phone Number:  [phone] (Legal Guardian) to notify him of pt's arrival/disposition.
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
## 2024-01-12 NOTE — BH Assessment (Signed)
 Patient has been accepted to [facility].  Patient assigned to [address]. Accepting physician is Dr. Morris Pepper.  Call report to [phone].  [place].   ER Staff is aware of it:  Gaet[place], ER Secretary  Dr. Jacolyn, ER MD  Emmie, Patient's Nurse     Patient can arrive at facility 01/12/24 anytime after 8 AM.   Legal Guardian has been notified.

## 2024-01-12 NOTE — BH Assessment (Signed)
 Destination  Service Provider Request Status Services Address Phone Fax Patient Preferred  [facility] Patient Placement  Pending - Request Sent -- [facility] [place], [place] [phone] --  [facility]  Pending - Request Sent -- [address] [place], [place] [address] [phone] --  [facility]  Pending - Request Sent -- [address] [place] [address] [phone] --  [facility]  Pending - Request Sent -- [address] [place] [address] [phone] --  [facility]  Pending - Request Sent -- [address] [place] [address] [phone] --  [facility]  Pending - Request Sent -- [address] [place] [address] [phone] --  [facility]  Pending - Request Sent -- [place] [phone] --  [facility]  Pending - Request Sent -- [address] [place] [address] [phone] --  [facility]  Pending - Request Sent -- [address], [place] [address] [phone] --  [facility]  Pending - Request Sent -- [address], [place] [address] [phone] --  [facility]  Pending - Request Sent -- [place] [phone] --

## 2024-01-12 NOTE — ED Provider Notes (Signed)
 3:37 PM patient wanting to leave but patient is listed as a legal guardian and we are waiting to hear from psychiatry if she would be cleared to leave or not as she was initially recommended for inpatient.   Ernest Ronal BRAVO, MD 01/12/24 1537

## 2024-01-12 NOTE — ED Provider Notes (Signed)
 Repeat evaluation of the patient by psychiatry Dr. Ruther.  He recommends transitioning trazodone  to Remeron for nightly assistance with sleep.  I refilled all of her medications and make this change.  Patient suitable for outpatient management.   Claudene Rover, MD 01/12/24 [phone]

## 2024-01-12 NOTE — BH Assessment (Addendum)
 Writer called patient's significant other, Clarence May [phone], who states he is not the patient's legal guardian. He stated that he does not have nor did he sign any paperwork. Writer informed that patient was recommended for inpatient admission but no longer wants to go. Sudie reports he would like for patient to go to detox for current drug use. Explained that we cannot force treatment/detox unless there are safety concerns. He stated he wanted patient to cool off to get substances out of her system.

## 2024-01-13 ENCOUNTER — Other Ambulatory Visit: Payer: Self-pay

## 2024-02-11 ENCOUNTER — Other Ambulatory Visit: Payer: Self-pay

## 2024-02-29 ENCOUNTER — Ambulatory Visit: Payer: MEDICAID

## 2024-03-06 ENCOUNTER — Other Ambulatory Visit: Payer: Self-pay

## 2024-03-06 MED ORDER — REXULTI 2 MG PO TABS
2.0000 mg | ORAL_TABLET | Freq: Every evening | ORAL | 2 refills | Status: AC
  Filled 2024-03-06: qty 30, 30d supply, fill #0

## 2024-03-07 ENCOUNTER — Other Ambulatory Visit: Payer: Self-pay

## 2024-03-07 MED ORDER — HYDROXYZINE HCL 25 MG PO TABS
ORAL_TABLET | ORAL | 1 refills | Status: AC
  Filled 2024-03-07: qty 120, 26d supply, fill #0

## 2024-03-07 MED ORDER — MIRTAZAPINE 15 MG PO TABS
15.0000 mg | ORAL_TABLET | Freq: Every evening | ORAL | 1 refills | Status: AC
  Filled 2024-03-07: qty 30, 30d supply, fill #0

## 2024-03-07 MED ORDER — TOPIRAMATE 25 MG PO TABS
25.0000 mg | ORAL_TABLET | Freq: Every day | ORAL | 0 refills | Status: AC
  Filled 2024-03-07: qty 30, 30d supply, fill #0

## 2024-03-08 NOTE — Patient Instructions (Incomplete)

## 2024-03-09 ENCOUNTER — Ambulatory Visit: Payer: MEDICAID

## 2024-03-09 DIAGNOSIS — Z124 Encounter for screening for malignant neoplasm of cervix: Secondary | ICD-10-CM

## 2024-03-09 DIAGNOSIS — Z720 Tobacco use: Secondary | ICD-10-CM

## 2024-03-09 DIAGNOSIS — Z789 Other specified health status: Secondary | ICD-10-CM

## 2024-03-09 DIAGNOSIS — Z1231 Encounter for screening mammogram for malignant neoplasm of breast: Secondary | ICD-10-CM

## 2024-03-09 DIAGNOSIS — Z23 Encounter for immunization: Secondary | ICD-10-CM

## 2024-03-21 ENCOUNTER — Ambulatory Visit: Payer: MEDICAID

## 2024-04-06 ENCOUNTER — Encounter: Payer: MEDICAID | Admitting: Obstetrics and Gynecology
